# Patient Record
Sex: Female | Born: 2000 | Race: Black or African American | Hispanic: No | Marital: Single | Smoking: Never smoker
Health system: Southern US, Community
[De-identification: ages and names within clinical notes are randomized; demographics above are authoritative.]

## PROBLEM LIST (undated history)

## (undated) DIAGNOSIS — J4520 Mild intermittent asthma, uncomplicated: Secondary | ICD-10-CM

## (undated) DIAGNOSIS — N83209 Unspecified ovarian cyst, unspecified side: Secondary | ICD-10-CM

## (undated) DIAGNOSIS — R519 Headache, unspecified: Secondary | ICD-10-CM

## (undated) DIAGNOSIS — U071 COVID-19: Secondary | ICD-10-CM

## (undated) DIAGNOSIS — E559 Vitamin D deficiency, unspecified: Secondary | ICD-10-CM

## (undated) HISTORY — DX: Vitamin D deficiency, unspecified: E55.9

## (undated) HISTORY — DX: Unspecified ovarian cyst, unspecified side: N83.209

## (undated) HISTORY — DX: Mild intermittent asthma, uncomplicated: J45.20

## (undated) HISTORY — DX: Headache, unspecified: R51.9

## (undated) HISTORY — PX: NO PAST SURGERIES: SHX2092

---
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
## 2019-08-07 ENCOUNTER — Encounter (HOSPITAL_COMMUNITY): Payer: Self-pay | Admitting: Emergency Medicine

## 2019-08-07 ENCOUNTER — Other Ambulatory Visit: Payer: Self-pay

## 2019-08-07 ENCOUNTER — Emergency Department (HOSPITAL_COMMUNITY)
Admission: EM | Admit: 2019-08-07 | Discharge: 2019-08-07 | Disposition: A | Discharge status: Home / Self Care | Payer: Managed Care, Other (non HMO) | Attending: Emergency Medicine | Admitting: Emergency Medicine

## 2019-08-07 DIAGNOSIS — J019 Acute sinusitis, unspecified: Secondary | ICD-10-CM | POA: Diagnosis not present

## 2019-08-07 DIAGNOSIS — Z79899 Other long term (current) drug therapy: Secondary | ICD-10-CM | POA: Diagnosis not present

## 2019-08-07 DIAGNOSIS — R0981 Nasal congestion: Secondary | ICD-10-CM | POA: Diagnosis present

## 2019-08-07 DIAGNOSIS — J069 Acute upper respiratory infection, unspecified: Secondary | ICD-10-CM | POA: Diagnosis not present

## 2019-08-07 DIAGNOSIS — J4520 Mild intermittent asthma, uncomplicated: Secondary | ICD-10-CM | POA: Insufficient documentation

## 2019-08-07 MED ORDER — FLUTICASONE PROPIONATE 50 MCG/ACT NA SUSP: 2.0000 | Freq: Every day | NASAL | 0 refills | Status: DC

## 2019-08-07 MED ORDER — FEXOFENADINE-PSEUDOEPHED ER 60-120 MG PO TB12: 1.0000 | ORAL_TABLET | Freq: Two times a day (BID) | ORAL | 0 refills | Status: DC

## 2019-08-07 NOTE — ED Provider Notes (Signed)
[facility] DEPT Provider Note   CSN: [id number] Arrival date & time: 08/07/19  1435     History   Chief Complaint Chief Complaint  Patient presents with  . Facial Pain  . Nasal Congestion    HPI Sara Mueller is a 18 y.o. female.     Patient is a 18 year old female with no significant past medical history presenting to emergency department for sinus pressure.  Reports over the last 3 days she has had worsening nasal congestion, dry scratchy throat, dry cough, sinus pressure and sinus pain.  Denies any ear pain, fever, chills, loss of taste or smell, chest pain, shortness of breath, sick contacts.  Has been trying over-the-counter medications without relief.     Past Medical History:  Diagnosis Date  . Constipation   . Intermittent asthma    QVAR rx'd in the past for more persistent symptoms,?compliant?.    Patient Active Problem List   Diagnosis Date Noted  . Health maintenance examination 05/31/2011  . Asthma, mild persistent 04/05/2011  . Headache due to trauma 02/18/2011    History reviewed. No pertinent surgical history.   OB History   No obstetric history on file.      Home Medications    Prior to Admission medications   Medication Sig Start Date End Date Taking? Authorizing Provider  Acidophilus Lactobacillus CAPS Take 1 capsule by mouth 2 (two) times daily as needed. Twice daily as needed for diarrhea Patient not taking: Reported on 02/01/2017 02/19/16   Jean Rosenthal, NP  fexofenadine-pseudoephedrine (ALLEGRA-D) 60-120 MG 12 hr tablet Take 1 tablet by mouth every 12 (twelve) hours. 08/07/19   Alveria Apley, PA-C  fluticasone (FLONASE) 50 MCG/ACT nasal spray Place 2 sprays into both nostrils daily. 08/07/19 09/06/19  Madilyn Hook A, PA-C  ibuprofen (ADVIL,MOTRIN) 200 MG tablet Take 1 tablet (200 mg total) by mouth every 8 (eight) hours as needed. Patient not taking: Reported on 02/01/2017 10/27/14   Jamse Mead,  PA-C  isometheptene-acetaminophen-dichloralphenazone (MIDRIN) [phone] MG capsule Take 1 capsule by mouth 4 (four) times daily as needed for migraine. Maximum 5 capsules in 12 hours for migraine headaches, 8 capsules in 24 hours for tension headaches. 02/01/17   Dorie Rank, MD  ondansetron (ZOFRAN ODT) 4 MG disintegrating tablet 4mg  ODT q4 hours prn nausea/vomit Patient not taking: Reported on 02/01/2017 02/28/15   Elnora Morrison, MD  ondansetron (ZOFRAN ODT) 4 MG disintegrating tablet Take 1 tablet (4 mg total) by mouth every 8 (eight) hours as needed for nausea or vomiting. Patient not taking: Reported on 02/01/2017 02/19/16   Jean Rosenthal, NP    Family History Family History  Problem Relation Age of Onset  . Asthma Brother   . Seizures Brother     Social History Social History   Tobacco Use  . Smoking status: Never Smoker  . Smokeless tobacco: Never Used  Substance Use Topics  . Alcohol use: No  . Drug use: No     Allergies   Patient has no known allergies.   Review of Systems Review of Systems  Constitutional: Negative for activity change, appetite change, diaphoresis, fatigue, fever and unexpected weight change.  HENT: Positive for congestion, postnasal drip, rhinorrhea, sinus pressure, sinus pain and sore throat. Negative for dental problem, drooling, ear discharge, facial swelling, mouth sores, nosebleeds, sneezing, tinnitus, trouble swallowing and voice change.   Respiratory: Positive for cough. Negative for chest tightness, shortness of breath, wheezing and stridor.   Cardiovascular: Negative for  chest pain, palpitations and leg swelling.  Gastrointestinal: Negative for abdominal distention, abdominal pain, nausea and vomiting.  Genitourinary: Negative for dysuria.  Musculoskeletal: Negative for back pain.  Skin: Negative for rash.  Neurological: Negative for dizziness, light-headedness and headaches.     Physical Exam Updated Vital Signs BP 113/73    Pulse (!) 110   Temp 99.1 F (37.3 C) (Oral)   Resp 18   SpO2 100%   Physical Exam Vitals signs and nursing note reviewed.  Constitutional:      General: She is not in acute distress.    Appearance: Normal appearance. She is not ill-appearing, toxic-appearing or diaphoretic.  HENT:     Head: Normocephalic.     Right Ear: Tympanic membrane normal.     Left Ear: Tympanic membrane normal.     Nose: Congestion and rhinorrhea present.     Mouth/Throat:     Mouth: Mucous membranes are moist.     Pharynx: Oropharynx is clear.  Eyes:     Conjunctiva/sclera: Conjunctivae normal.     Pupils: Pupils are equal, round, and reactive to light.  Cardiovascular:     Rate and Rhythm: Normal rate and regular rhythm.  Pulmonary:     Effort: Pulmonary effort is normal.     Breath sounds: Normal breath sounds. No wheezing or rhonchi.  Abdominal:     General: Abdomen is flat.  Skin:    General: Skin is dry.  Neurological:     General: No focal deficit present.     Mental Status: She is alert.  Psychiatric:        Mood and Affect: Mood normal.      ED Treatments / Results  Labs (all labs ordered are listed, but only abnormal results are displayed) Labs Reviewed - No data to display  EKG None  Radiology No results found.  Procedures Procedures (including critical care time)  Medications Ordered in ED Medications - No data to display   Initial Impression / Assessment and Plan / ED Course  I have reviewed the triage vital signs and the nursing notes.  Pertinent labs & imaging results that were available during my care of the patient were reviewed by me and considered in my medical decision making (see chart for details).  Clinical Course as of Aug 06 1614  Tue Aug 07, 2019  1614 Patient here with symptoms of allergic rhinitis. Happens every years around this time but has been more persistent this time. No fever. No covid exposure, declines covid test. Will treat with allegra and  fluticasone. Advised on return precautions   [KM]    Clinical Course User Index [KM] Arlyn DunningMcLean, Miamor Ayler A, PA-C       Based on review of vitals, medical screening exam, lab work and/or imaging, there does not appear to be an acute, emergent etiology for the patient's symptoms. Counseled pt on good return precautions and encouraged both PCP and ED follow-up as needed.  Prior to discharge, I also discussed incidental imaging findings with patient in detail and advised appropriate, recommended follow-up in detail.  Clinical Impression: 1. Acute non-recurrent sinusitis, unspecified location   2. Acute upper respiratory infection     Disposition: Discharge  Prior to providing a prescription for a controlled substance, I independently reviewed the patient's recent prescription history on the [place]  Controlled Substance Reporting System. The patient had no recent or regular prescriptions and was deemed appropriate for a brief, less than 3 day prescription of narcotic for acute analgesia.  This note was prepared with assistance of Conservation officer, historic buildings. Occasional wrong-word or sound-a-like substitutions may have occurred due to the inherent limitations of voice recognition software.    Final Clinical Impressions(s) / ED Diagnoses   Final diagnoses:  Acute non-recurrent sinusitis, unspecified location  Acute upper respiratory infection    ED Discharge Orders         Ordered    fluticasone (FLONASE) 50 MCG/ACT nasal spray  Daily     08/07/19 1613    fexofenadine-pseudoephedrine (ALLEGRA-D) 60-120 MG 12 hr tablet  Every 12 hours     08/07/19 1613           Jeral Pinch 08/07/19 1615    Lorre Nick, MD 08/09/19 1347

## 2019-08-07 NOTE — Discharge Instructions (Signed)
Thank you for allowing me to care for you today. Please return to the emergency department if you have new or worsening symptoms. Take your medications as instructed.  ° °

## 2019-08-07 NOTE — ED Triage Notes (Signed)
Pt c/o nasal congestion and sinus facial pains since this weekend. Took Elderberry and Sudafed yesterday.

## 2019-10-21 ENCOUNTER — Other Ambulatory Visit: Payer: Self-pay

## 2019-10-21 ENCOUNTER — Encounter (HOSPITAL_COMMUNITY): Payer: Self-pay | Admitting: Emergency Medicine

## 2019-10-21 ENCOUNTER — Emergency Department (HOSPITAL_COMMUNITY)
Admission: EM | Admit: 2019-10-21 | Discharge: 2019-10-21 | Disposition: A | Discharge status: Home / Self Care | Payer: Managed Care, Other (non HMO) | Attending: Emergency Medicine | Admitting: Emergency Medicine

## 2019-10-21 DIAGNOSIS — U071 COVID-19: Secondary | ICD-10-CM | POA: Diagnosis not present

## 2019-10-21 DIAGNOSIS — R000 Tachycardia, unspecified: Secondary | ICD-10-CM | POA: Diagnosis present

## 2019-10-21 DIAGNOSIS — Z79899 Other long term (current) drug therapy: Secondary | ICD-10-CM | POA: Insufficient documentation

## 2019-10-21 DIAGNOSIS — J45909 Unspecified asthma, uncomplicated: Secondary | ICD-10-CM | POA: Insufficient documentation

## 2019-10-21 HISTORY — DX: COVID-19: U07.1

## 2019-10-21 NOTE — ED Provider Notes (Signed)
[facility] EMERGENCY DEPARTMENT Provider Note   CSN: [id number] Arrival date & time: 10/21/19  1416     History Chief Complaint  Patient presents with   COVID +   Tachycardia    Sara Mueller is a 19 y.o. female with PMH significant for positive COVID-19 testing 10/18/2019 at [facility] who presents to the ED with complaints of fluctuating tachycardia according to her phone.  Patient has an app on her phone that she has been checking regularly to gauge heart rate.  She reportedly had symptoms of headache, cough, nausea, fatigue, body aches, and congestion that began 10/16/2019 and effectively resolved 10/19/2019.  She does endorse some loose stools the past couple of days as well as some diminished p.o. intake.  She reports that she has not had any fevers or chills since 10/19/2019.  She presents to the ED purely because she is concerned that her elevated heart rate is particularly concerning for an emergent process.  Prior to her COVID-19 diagnosis, she did not take any medications regularly and has no significant PMH.  She denies any abdominal pain, nausea or vomiting, chest pain, pleuritic cough or pleuritic chest discomfort, difficulty breathing, syncopal episode, or dizziness.  She lives at home with her mother who has been asymptomatic, but she is isolating in her room.  HPI     Past Medical History:  Diagnosis Date   Constipation    COVID-19    Intermittent asthma    QVAR rx'd in the past for more persistent symptoms,?compliant?.    Patient Active Problem List   Diagnosis Date Noted   Health maintenance examination 05/31/2011   Asthma, mild persistent 04/05/2011   Headache due to trauma 02/18/2011    History reviewed. No pertinent surgical history.   OB History   No obstetric history on file.     Family History  Problem Relation Age of Onset   Asthma Brother    Seizures Brother     Social History   Tobacco Use   Smoking status: Never  Smoker   Smokeless tobacco: Never Used  Substance Use Topics   Alcohol use: No   Drug use: No    Home Medications Prior to Admission medications   Medication Sig Start Date End Date Taking? Authorizing Provider  Acidophilus Lactobacillus CAPS Take 1 capsule by mouth 2 (two) times daily as needed. Twice daily as needed for diarrhea Patient not taking: Reported on 02/01/2017 02/19/16   Sherrilee Gilles, NP  fexofenadine-pseudoephedrine (ALLEGRA-D) 60-120 MG 12 hr tablet Take 1 tablet by mouth every 12 (twelve) hours. 08/07/19   Arlyn Dunning, PA-C  fluticasone (FLONASE) 50 MCG/ACT nasal spray Place 2 sprays into both nostrils daily. 08/07/19 09/06/19  Ronnie Doss A, PA-C  ibuprofen (ADVIL,MOTRIN) 200 MG tablet Take 1 tablet (200 mg total) by mouth every 8 (eight) hours as needed. Patient not taking: Reported on 02/01/2017 10/27/14   Raymon Mutton, PA-C  isometheptene-acetaminophen-dichloralphenazone (MIDRIN) [phone] MG capsule Take 1 capsule by mouth 4 (four) times daily as needed for migraine. Maximum 5 capsules in 12 hours for migraine headaches, 8 capsules in 24 hours for tension headaches. 02/01/17   Linwood Dibbles, MD  ondansetron (ZOFRAN ODT) 4 MG disintegrating tablet 4mg  ODT q4 hours prn nausea/vomit Patient not taking: Reported on 02/01/2017 02/28/15   03/02/15, MD  ondansetron (ZOFRAN ODT) 4 MG disintegrating tablet Take 1 tablet (4 mg total) by mouth every 8 (eight) hours as needed for nausea or vomiting. Patient not  taking: Reported on 02/01/2017 02/19/16   Jean Rosenthal, NP    Allergies    Patient has no known allergies.  Review of Systems   Review of Systems  All other systems reviewed and are negative.   Physical Exam Updated Vital Signs BP 130/70    Pulse 98    Temp 98.5 F (36.9 C) (Oral)    Resp 16    SpO2 100%   Physical Exam Vitals and nursing note reviewed. Exam conducted with a chaperone present.  Constitutional:      Appearance: Normal  appearance.  HENT:     Head: Normocephalic and atraumatic.  Eyes:     General: No scleral icterus.    Conjunctiva/sclera: Conjunctivae normal.  Cardiovascular:     Rate and Rhythm: Normal rate and regular rhythm.     Pulses: Normal pulses.     Heart sounds: Normal heart sounds.  Pulmonary:     Effort: Pulmonary effort is normal. No respiratory distress.     Breath sounds: Normal breath sounds.  Musculoskeletal:     Right lower leg: No edema.     Left lower leg: No edema.  Skin:    General: Skin is dry.  Neurological:     Mental Status: She is alert.     GCS: GCS eye subscore is 4. GCS verbal subscore is 5. GCS motor subscore is 6.  Psychiatric:        Mood and Affect: Mood normal.        Behavior: Behavior normal.        Thought Content: Thought content normal.     ED Results / Procedures / Treatments   Labs (all labs ordered are listed, but only abnormal results are displayed) Labs Reviewed - No data to display  EKG None  Radiology No results found.  Procedures Procedures (including critical care time)  Medications Ordered in ED Medications - No data to display  ED Course  I have reviewed the triage vital signs and the nursing notes.  Pertinent labs & imaging results that were available during my care of the patient were reviewed by me and considered in my medical decision making (see chart for details).    MDM Rules/Calculators/A&P                      Patient is still recovering from a COVID-19 diagnosis which is likely why she is still feeling ill.  She denies any history of coagulopathy, DVT, PE, recent surgery, recent mobilization.  While COVID-19 may contribute to prothrombotic state, low suspicion for pulmonary was not.  No evidence of clinical DVT.  She is in no acute distress on my exam.  Her vital signs are all within normal limits.  Patient showed me a log on her phone, checking her vital signs approximately every 10 minutes for a 2-hour duration.   She was within normal limits more times than not.  I believe that there is element of anxiety contributing to her elevated heart rate.  She is also having loose stools and is admitted that she is not been hydrating as well as she knows she should be.  Suspect that mild dehydration is also contributing to her mild, intermittent tachycardia.  She is hemodynamically stable and safe for discharge at this time.  Encouraged patient to use Tylenol or ibuprofen should she develop any fevers, chills, or body aches.  I had a conversation with patient about why lab work and imaging is not warranted  at this time.  Discussed return precautions with the patient.  She voiced understanding and is agreeable to plan.  JAYLANIE BOSCHEE was evaluated in Emergency Department on 10/21/2019 for the symptoms described in the history of present illness. She was evaluated in the context of the global COVID-19 pandemic, which necessitated consideration that the patient might be at risk for infection with the SARS-CoV-2 virus that causes COVID-19. Institutional protocols and algorithms that pertain to the evaluation of patients at risk for COVID-19 are in a state of rapid change based on information released by regulatory bodies including the CDC and federal and state organizations. These policies and algorithms were followed during the patient's care in the ED.   Final Clinical Impression(s) / ED Diagnoses Final diagnoses:  COVID-19    Rx / DC Orders ED Discharge Orders    None       Lorelee New, PA-C 10/21/19 1610    Tegeler, Canary Brim, MD 10/22/19 [phone]

## 2019-10-21 NOTE — ED Notes (Signed)
Patient Alert and oriented to baseline. Stable and ambulatory to baseline. Patient verbalized understanding of the discharge instructions.  Patient belongings were taken by the patient.   

## 2019-10-21 NOTE — Discharge Instructions (Addendum)
Please read the attachments.  Return to the ED or seek medical attention for any new or worsening symptoms per our discussion.  Consider pulse oximeter.

## 2019-10-21 NOTE — ED Triage Notes (Signed)
Pt states she was COVID + on Thursday.  Reports she had symptoms Tuesday-Thursday but then feeling fine.  Reports fluctuating HR from 80-118 according to her watch.  Denies any symptoms at present.

## 2021-01-22 DIAGNOSIS — J309 Allergic rhinitis, unspecified: Secondary | ICD-10-CM | POA: Insufficient documentation

## 2021-01-26 DIAGNOSIS — F329 Major depressive disorder, single episode, unspecified: Secondary | ICD-10-CM | POA: Insufficient documentation

## 2021-01-30 ENCOUNTER — Emergency Department (HOSPITAL_COMMUNITY)
Admission: EM | Admit: 2021-01-30 | Discharge: 2021-01-30 | Discharge status: Against Medical Advice | Payer: Managed Care, Other (non HMO)

## 2021-01-30 ENCOUNTER — Other Ambulatory Visit: Payer: Self-pay

## 2021-07-16 ENCOUNTER — Emergency Department (HOSPITAL_COMMUNITY)
Admission: EM | Admit: 2021-07-16 | Discharge: 2021-07-17 | Disposition: A | Discharge status: Home / Self Care | Payer: Managed Care, Other (non HMO) | Attending: Emergency Medicine | Admitting: Emergency Medicine

## 2021-07-16 ENCOUNTER — Encounter (HOSPITAL_COMMUNITY): Payer: Self-pay

## 2021-07-16 DIAGNOSIS — Z5321 Procedure and treatment not carried out due to patient leaving prior to being seen by health care provider: Secondary | ICD-10-CM | POA: Diagnosis not present

## 2021-07-16 DIAGNOSIS — N9489 Other specified conditions associated with female genital organs and menstrual cycle: Secondary | ICD-10-CM | POA: Diagnosis not present

## 2021-07-16 DIAGNOSIS — R1110 Vomiting, unspecified: Secondary | ICD-10-CM | POA: Insufficient documentation

## 2021-07-16 DIAGNOSIS — M546 Pain in thoracic spine: Secondary | ICD-10-CM | POA: Diagnosis not present

## 2021-07-16 LAB — COMPREHENSIVE METABOLIC PANEL
ALT: 16 U/L (ref 0–44)
AST: 17 U/L (ref 15–41)
Albumin: 4 g/dL (ref 3.5–5.0)
Alkaline Phosphatase: 113 U/L (ref 38–126)
Anion gap: 10 (ref 5–15)
BUN: 6 mg/dL (ref 6–20)
CO2: 21 mmol/L — ABNORMAL LOW (ref 22–32)
Calcium: 9.1 mg/dL (ref 8.9–10.3)
Chloride: 104 mmol/L (ref 98–111)
Creatinine, Ser: 0.79 mg/dL (ref 0.44–1.00)
GFR, Estimated: >60 mL/min (ref >60)
Glucose, Bld: 94 mg/dL (ref 70–99)
Potassium: 3.6 mmol/L (ref 3.5–5.1)
Sodium: 135 mmol/L (ref 135–145)
Total Bilirubin: 0.6 mg/dL (ref 0.3–1.2)
Total Protein: 7.3 g/dL (ref 6.5–8.1)

## 2021-07-16 LAB — URINALYSIS, ROUTINE W REFLEX MICROSCOPIC
Bilirubin Urine: NEGATIVE
Glucose, UA: NEGATIVE mg/dL
Hgb urine dipstick: NEGATIVE
Ketones, ur: 20 mg/dL — AB
Leukocytes,Ua: NEGATIVE
Nitrite: NEGATIVE
Protein, ur: NEGATIVE mg/dL
Specific Gravity, Urine: 1.02 (ref 1.005–1.030)
pH: 6 (ref 5.0–8.0)

## 2021-07-16 LAB — CBC WITH DIFFERENTIAL/PLATELET
Abs Immature Granulocytes: 0.06 10*3/uL (ref 0.00–0.07)
Basophils Absolute: 0.1 10*3/uL (ref 0.0–0.1)
Basophils Relative: 1 %
Eosinophils Absolute: 0.1 10*3/uL (ref 0.0–0.5)
Eosinophils Relative: 1 %
HCT: 41.9 % (ref 36.0–46.0)
Hemoglobin: 12.8 g/dL (ref 12.0–15.0)
Immature Granulocytes: 1 %
Lymphocytes Relative: 5 %
Lymphs Abs: 0.5 10*3/uL — ABNORMAL LOW (ref 0.7–4.0)
MCH: 23.4 pg — ABNORMAL LOW (ref 26.0–34.0)
MCHC: 30.5 g/dL (ref 30.0–36.0)
MCV: 76.6 fL — ABNORMAL LOW (ref 80.0–100.0)
Monocytes Absolute: 1.1 10*3/uL — ABNORMAL HIGH (ref 0.1–1.0)
Monocytes Relative: 11 %
Neutro Abs: 8.3 10*3/uL — ABNORMAL HIGH (ref 1.7–7.7)
Neutrophils Relative %: 81 %
Platelets: 227 10*3/uL (ref 150–400)
RBC: 5.47 MIL/uL — ABNORMAL HIGH (ref 3.87–5.11)
RDW: 15.5 % (ref 11.5–15.5)
WBC: 10.1 10*3/uL (ref 4.0–10.5)
nRBC: 0 % (ref 0.0–0.2)

## 2021-07-16 LAB — I-STAT BETA HCG BLOOD, ED (MC, WL, AP ONLY): I-stat hCG, quantitative: <5 m[IU]/mL (ref <5)

## 2021-07-16 MED ORDER — ONDANSETRON 4 MG PO TBDP
4.0000 mg | ORAL_TABLET | Freq: Once | ORAL | Status: AC
  Administered 2021-07-16: 4 mg via ORAL
  Filled 2021-07-16: qty 1

## 2021-07-16 NOTE — ED Triage Notes (Signed)
Pt states she is unable to keep anything down for since yesterday. Pt also c.o upper back pain. LMP 9/13

## 2021-07-16 NOTE — ED Provider Notes (Signed)
Emergency Medicine Provider Triage Evaluation Note  Sara Mueller , a 20 y.o. female  was evaluated in triage.  Pt complains of emesis.  Multiple episodes of NBNB emesis over the last week.  She was seen by her primary care provider for vaginal bleeding after sexual intercourse on Monday.  At that time had negative pregnancy test.  She has been unable to keep down anything p.o. over the last 24 hours.  Has had some intermittent vaginal bleeding over the last week.  No abdominal pain.  Review of Systems  Positive: Vaginal bleeding, Emesis Negative: Fever, abd pain, pelvic pain  Physical Exam  BP 121/64 (BP Location: Left Arm)   Pulse (!) 109   Temp 98.8 F (37.1 C) (Oral)   Resp 14   LMP 06/23/2021   SpO2 98%  Gen:   Awake, no distress   Resp:  Normal effort  MSK:   Moves extremities without difficulty  Other:    Medical Decision Making  Medically screening exam initiated at 2:42 PM.  Appropriate orders placed.  Claudie Leach was informed that the remainder of the evaluation will be completed by another provider, this initial triage assessment does not replace that evaluation, and the importance of remaining in the ED until their evaluation is complete.  Emesis, vaginal bleeding   Sharde Gover A, PA-C 07/16/21 1443    Maia Plan, MD 07/20/21 [phone]

## 2021-07-17 NOTE — ED Notes (Signed)
Pt called 3x no answer  

## 2021-09-18 ENCOUNTER — Other Ambulatory Visit: Payer: Self-pay | Admitting: Obstetrics & Gynecology

## 2021-09-18 DIAGNOSIS — R222 Localized swelling, mass and lump, trunk: Secondary | ICD-10-CM

## 2021-09-19 ENCOUNTER — Other Ambulatory Visit: Payer: Self-pay | Admitting: Obstetrics & Gynecology

## 2021-09-19 DIAGNOSIS — R102 Pelvic and perineal pain: Secondary | ICD-10-CM

## 2021-10-11 NOTE — L&D Delivery Note (Signed)
[facility]N [facility] Delivery Note  Sara Mueller is a 21 y.o. G1P0 s/p VAVD at [redacted]w[redacted]d. She was admitted for latent labor/DFM.   ROM: 11h 15m with thick meconium fluid GBS Status:  Negative/-- (10/11 1606) Maximum Maternal Temperature: 101.4, treated for triple I w/ tylenol, amp +gent  Labor Progress: Initial SVE: 3/80/-2. She then progressed to complete.   Delivery Date/Time: 08/20/2022 @2319  Delivery: Presented to room and started pushing with patient. Dr. presented to the room with baby at station 0 and did several pushes with lateral pubic pressure support. Station improved to +2. The NICU team was called for delivery. A vacuum was applied 2/2 low HR and fetal intolerance. She pushed for a total of 8 mins thereafter. The initial vacuum with this provider had 2 pop offs. Dr. Shawnie Pons resumed vacuum pushing for additional 3 pushes. Head delivered ROP. A loose body cord was present. Shoulder and body delivered in usual fashion. The cord was clamped x 2 immediately by this provider and handed over to the waiting neonatologist team. Cord gases drawn. Cord blood drawn. Placenta delivered spontaneously with gentle cord traction. The fundus firm with massage and Pitocin. Labia, perineum, vagina, and cervix inspected and found to have a 3rd degree class B. This laceration was repaired with 3.0 vicyl.   Baby Weight: pending  Placenta: 3 vessel, intact. Meconium stained. Sent to pathology Complications: Vacuum assisted 2/2 prolonged deceleration of fetal HR Lacerations: 3rd degree class B EBL: 420 mL Analgesia: Epidural   Infant:  APGAR (1 MIN): 1   APGAR (5 MINS): 7   APGAR (10 MINS): 8  Shawnie Pons, DO OB Fellow, Faculty Lav[facility] J[facility], [facility] 08/20/2022, 1:03 AM

## 2021-10-15 ENCOUNTER — Encounter (HOSPITAL_BASED_OUTPATIENT_CLINIC_OR_DEPARTMENT_OTHER): Payer: Self-pay | Admitting: Emergency Medicine

## 2021-10-15 ENCOUNTER — Emergency Department (HOSPITAL_BASED_OUTPATIENT_CLINIC_OR_DEPARTMENT_OTHER)
Admission: EM | Admit: 2021-10-15 | Discharge: 2021-10-15 | Disposition: A | Discharge status: Home / Self Care | Payer: Managed Care, Other (non HMO) | Attending: Emergency Medicine | Admitting: Emergency Medicine

## 2021-10-15 ENCOUNTER — Other Ambulatory Visit: Payer: Self-pay

## 2021-10-15 ENCOUNTER — Other Ambulatory Visit: Payer: Managed Care, Other (non HMO)

## 2021-10-15 DIAGNOSIS — K529 Noninfective gastroenteritis and colitis, unspecified: Secondary | ICD-10-CM | POA: Insufficient documentation

## 2021-10-15 DIAGNOSIS — Z8616 Personal history of COVID-19: Secondary | ICD-10-CM | POA: Insufficient documentation

## 2021-10-15 DIAGNOSIS — R112 Nausea with vomiting, unspecified: Secondary | ICD-10-CM | POA: Diagnosis present

## 2021-10-15 DIAGNOSIS — R197 Diarrhea, unspecified: Secondary | ICD-10-CM

## 2021-10-15 DIAGNOSIS — J4530 Mild persistent asthma, uncomplicated: Secondary | ICD-10-CM | POA: Diagnosis not present

## 2021-10-15 MED ORDER — ONDANSETRON 4 MG PO TBDP: 4.0000 mg | ORAL_TABLET | Freq: Three times a day (TID) | ORAL | 0 refills | Status: AC | PRN

## 2021-10-15 MED ORDER — ONDANSETRON 4 MG PO TBDP
4.0000 mg | ORAL_TABLET | Freq: Once | ORAL | Status: AC
  Administered 2021-10-15: 4 mg via ORAL
  Filled 2021-10-15: qty 1

## 2021-10-15 NOTE — ED Triage Notes (Signed)
Pt presents to ED POV. Pt c/o n/v/d, cough since yesterday. Denies fever.

## 2021-10-15 NOTE — ED Notes (Signed)
Pt with ginger ale and crackers at bedside for PO challenge
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
## 2021-11-10 ENCOUNTER — Other Ambulatory Visit: Payer: Self-pay

## 2021-11-10 ENCOUNTER — Encounter (HOSPITAL_BASED_OUTPATIENT_CLINIC_OR_DEPARTMENT_OTHER): Payer: Self-pay

## 2021-11-10 ENCOUNTER — Emergency Department (HOSPITAL_BASED_OUTPATIENT_CLINIC_OR_DEPARTMENT_OTHER): Payer: Managed Care, Other (non HMO)

## 2021-11-10 ENCOUNTER — Emergency Department (HOSPITAL_BASED_OUTPATIENT_CLINIC_OR_DEPARTMENT_OTHER)
Admission: EM | Admit: 2021-11-10 | Discharge: 2021-11-10 | Disposition: A | Discharge status: Home / Self Care | Payer: Managed Care, Other (non HMO) | Attending: Emergency Medicine | Admitting: Emergency Medicine

## 2021-11-10 DIAGNOSIS — N83292 Other ovarian cyst, left side: Secondary | ICD-10-CM | POA: Diagnosis not present

## 2021-11-10 DIAGNOSIS — N83291 Other ovarian cyst, right side: Secondary | ICD-10-CM | POA: Insufficient documentation

## 2021-11-10 DIAGNOSIS — R102 Pelvic and perineal pain: Secondary | ICD-10-CM | POA: Diagnosis present

## 2021-11-10 DIAGNOSIS — N83209 Unspecified ovarian cyst, unspecified side: Secondary | ICD-10-CM

## 2021-11-10 LAB — PREGNANCY, URINE: Preg Test, Ur: NEGATIVE

## 2021-11-10 MED ORDER — IBUPROFEN 800 MG PO TABS
800.0000 mg | ORAL_TABLET | Freq: Once | ORAL | Status: AC
  Administered 2021-11-10: 800 mg via ORAL
  Filled 2021-11-10: qty 1

## 2021-11-10 MED ORDER — ONDANSETRON 4 MG PO TBDP
4.0000 mg | ORAL_TABLET | Freq: Once | ORAL | Status: AC
  Administered 2021-11-10: 4 mg via ORAL
  Filled 2021-11-10: qty 1

## 2021-11-10 NOTE — Discharge Instructions (Signed)
You were seen in the ER today for your pelvic pain.  You were found of a ruptured ovarian cyst on your ultrasound.  Your pain and bleeding is likely related to your menstrual cycle.  You may alternate Tylenol and naproxen as you have at home.  Follow-up with your primary care doctor and return to ER if develop any other new severe symptoms
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
## 2021-12-04 ENCOUNTER — Emergency Department (HOSPITAL_COMMUNITY): Payer: Managed Care, Other (non HMO)

## 2021-12-04 ENCOUNTER — Other Ambulatory Visit: Payer: Self-pay

## 2021-12-04 ENCOUNTER — Emergency Department (HOSPITAL_COMMUNITY)
Admission: EM | Admit: 2021-12-04 | Discharge: 2021-12-04 | Disposition: A | Discharge status: Home / Self Care | Payer: Managed Care, Other (non HMO) | Attending: Emergency Medicine | Admitting: Emergency Medicine

## 2021-12-04 DIAGNOSIS — N83209 Unspecified ovarian cyst, unspecified side: Secondary | ICD-10-CM | POA: Insufficient documentation

## 2021-12-04 DIAGNOSIS — Y92410 Unspecified street and highway as the place of occurrence of the external cause: Secondary | ICD-10-CM | POA: Insufficient documentation

## 2021-12-04 DIAGNOSIS — R102 Pelvic and perineal pain: Secondary | ICD-10-CM

## 2021-12-04 DIAGNOSIS — Z7951 Long term (current) use of inhaled steroids: Secondary | ICD-10-CM | POA: Insufficient documentation

## 2021-12-04 LAB — COMPREHENSIVE METABOLIC PANEL
ALT: 11 U/L (ref 0–44)
AST: 14 U/L — ABNORMAL LOW (ref 15–41)
Albumin: 3.7 g/dL (ref 3.5–5.0)
Alkaline Phosphatase: 79 U/L (ref 38–126)
Anion gap: 7 (ref 5–15)
BUN: 9 mg/dL (ref 6–20)
CO2: 21 mmol/L — ABNORMAL LOW (ref 22–32)
Calcium: 9.1 mg/dL (ref 8.9–10.3)
Chloride: 108 mmol/L (ref 98–111)
Creatinine, Ser: 0.89 mg/dL (ref 0.44–1.00)
GFR, Estimated: >60 mL/min (ref >60)
Glucose, Bld: 95 mg/dL (ref 70–99)
Potassium: 3.9 mmol/L (ref 3.5–5.1)
Sodium: 136 mmol/L (ref 135–145)
Total Bilirubin: 0.3 mg/dL (ref 0.3–1.2)
Total Protein: 6.7 g/dL (ref 6.5–8.1)

## 2021-12-04 LAB — CBC WITH DIFFERENTIAL/PLATELET
Abs Immature Granulocytes: 0.04 10*3/uL (ref 0.00–0.07)
Basophils Absolute: 0.1 10*3/uL (ref 0.0–0.1)
Basophils Relative: 0 %
Eosinophils Absolute: 0.2 10*3/uL (ref 0.0–0.5)
Eosinophils Relative: 2 %
HCT: 39.7 % (ref 36.0–46.0)
Hemoglobin: 12 g/dL (ref 12.0–15.0)
Immature Granulocytes: 0 %
Lymphocytes Relative: 18 %
Lymphs Abs: 2.2 10*3/uL (ref 0.7–4.0)
MCH: 23.4 pg — ABNORMAL LOW (ref 26.0–34.0)
MCHC: 30.2 g/dL (ref 30.0–36.0)
MCV: 77.5 fL — ABNORMAL LOW (ref 80.0–100.0)
Monocytes Absolute: 1 10*3/uL (ref 0.1–1.0)
Monocytes Relative: 8 %
Neutro Abs: 8.7 10*3/uL — ABNORMAL HIGH (ref 1.7–7.7)
Neutrophils Relative %: 72 %
Platelets: 197 10*3/uL (ref 150–400)
RBC: 5.12 MIL/uL — ABNORMAL HIGH (ref 3.87–5.11)
RDW: 15.3 % (ref 11.5–15.5)
WBC: 12.2 10*3/uL — ABNORMAL HIGH (ref 4.0–10.5)
nRBC: 0 % (ref 0.0–0.2)

## 2021-12-04 LAB — URINALYSIS, ROUTINE W REFLEX MICROSCOPIC
Bilirubin Urine: NEGATIVE
Glucose, UA: NEGATIVE mg/dL
Hgb urine dipstick: NEGATIVE
Ketones, ur: NEGATIVE mg/dL
Leukocytes,Ua: NEGATIVE
Nitrite: NEGATIVE
Protein, ur: NEGATIVE mg/dL
Specific Gravity, Urine: 1.018 (ref 1.005–1.030)
pH: 5 (ref 5.0–8.0)

## 2021-12-04 LAB — PREGNANCY, URINE: Preg Test, Ur: NEGATIVE

## 2021-12-04 MED ORDER — MORPHINE SULFATE (PF) 4 MG/ML IV SOLN
4.0000 mg | Freq: Once | INTRAVENOUS | Status: AC
Start: 2021-12-04 — End: 2021-12-04
  Administered 2021-12-04: 4 mg via INTRAVENOUS
  Filled 2021-12-04: qty 1

## 2021-12-04 MED ORDER — NAPROXEN 500 MG PO TABS: 500.0000 mg | ORAL_TABLET | Freq: Two times a day (BID) | ORAL | 0 refills | Status: DC

## 2021-12-04 MED ORDER — ONDANSETRON 4 MG PO TBDP
4.0000 mg | ORAL_TABLET | Freq: Once | ORAL | Status: AC
  Administered 2021-12-04: 4 mg via ORAL
  Filled 2021-12-04: qty 1

## 2021-12-04 MED ORDER — METHOCARBAMOL 500 MG PO TABS: 500.0000 mg | ORAL_TABLET | Freq: Two times a day (BID) | ORAL | 0 refills | Status: DC

## 2021-12-04 MED ORDER — IOHEXOL 300 MG/ML  SOLN
100.0000 mL | Freq: Once | INTRAMUSCULAR | Status: AC | PRN
  Administered 2021-12-04: 100 mL via INTRAVENOUS

## 2021-12-04 MED ORDER — ONDANSETRON 4 MG PO TBDP: 4.0000 mg | ORAL_TABLET | Freq: Three times a day (TID) | ORAL | 0 refills | Status: DC | PRN
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
## 2021-12-04 NOTE — Discharge Instructions (Addendum)
Please pick up medications and take as needed.  DO NOT DRIVE OR DRINK ALCOHOL WHILE ON THE MUSCLE RELAXER AS IT CAN MAKE YOU DROWSY.   Follow up with your OBGYN and PCP regarding ED visit.   Return to the ED for any new/worsening symptoms
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
## 2021-12-31 LAB — OB RESULTS CONSOLE RUBELLA ANTIBODY, IGM: Rubella: IMMUNE

## 2021-12-31 LAB — OB RESULTS CONSOLE ABO/RH: RH Type: POSITIVE

## 2021-12-31 LAB — OB RESULTS CONSOLE HIV ANTIBODY (ROUTINE TESTING): HIV: NONREACTIVE

## 2021-12-31 LAB — OB RESULTS CONSOLE ANTIBODY SCREEN: Antibody Screen: NEGATIVE

## 2021-12-31 LAB — OB RESULTS CONSOLE HEPATITIS B SURFACE ANTIGEN: Hepatitis B Surface Ag: NEGATIVE

## 2021-12-31 LAB — OB RESULTS CONSOLE RPR: RPR: NONREACTIVE

## 2022-01-13 LAB — OB RESULTS CONSOLE GC/CHLAMYDIA
Chlamydia: NEGATIVE
Neisseria Gonorrhea: NEGATIVE

## 2022-02-07 ENCOUNTER — Other Ambulatory Visit: Payer: Self-pay

## 2022-02-07 ENCOUNTER — Encounter (HOSPITAL_BASED_OUTPATIENT_CLINIC_OR_DEPARTMENT_OTHER): Payer: Self-pay | Admitting: Emergency Medicine

## 2022-02-07 ENCOUNTER — Emergency Department (HOSPITAL_BASED_OUTPATIENT_CLINIC_OR_DEPARTMENT_OTHER)
Admission: EM | Admit: 2022-02-07 | Discharge: 2022-02-07 | Disposition: A | Discharge status: Home / Self Care | Payer: Managed Care, Other (non HMO) | Attending: Emergency Medicine | Admitting: Emergency Medicine

## 2022-02-07 DIAGNOSIS — O219 Vomiting of pregnancy, unspecified: Secondary | ICD-10-CM | POA: Insufficient documentation

## 2022-02-07 DIAGNOSIS — Z3A11 11 weeks gestation of pregnancy: Secondary | ICD-10-CM | POA: Diagnosis not present

## 2022-02-07 DIAGNOSIS — R824 Acetonuria: Secondary | ICD-10-CM | POA: Insufficient documentation

## 2022-02-07 LAB — URINALYSIS, ROUTINE W REFLEX MICROSCOPIC
Bilirubin Urine: NEGATIVE
Glucose, UA: NEGATIVE mg/dL
Hgb urine dipstick: NEGATIVE
Ketones, ur: 15 mg/dL — AB
Leukocytes,Ua: NEGATIVE
Nitrite: NEGATIVE
Protein, ur: NEGATIVE mg/dL
Specific Gravity, Urine: >=1.03 (ref 1.005–1.030)
pH: 6.5 (ref 5.0–8.0)

## 2022-02-07 LAB — COMPREHENSIVE METABOLIC PANEL
ALT: 7 U/L (ref 0–44)
AST: 16 U/L (ref 15–41)
Albumin: 3.7 g/dL (ref 3.5–5.0)
Alkaline Phosphatase: 47 U/L (ref 38–126)
Anion gap: 6 (ref 5–15)
BUN: 8 mg/dL (ref 6–20)
CO2: 21 mmol/L — ABNORMAL LOW (ref 22–32)
Calcium: 9.2 mg/dL (ref 8.9–10.3)
Chloride: 108 mmol/L (ref 98–111)
Creatinine, Ser: 0.63 mg/dL (ref 0.44–1.00)
GFR, Estimated: >60 mL/min (ref >60)
Glucose, Bld: 80 mg/dL (ref 70–99)
Potassium: 4.1 mmol/L (ref 3.5–5.1)
Sodium: 135 mmol/L (ref 135–145)
Total Bilirubin: 0.6 mg/dL (ref 0.3–1.2)
Total Protein: 7 g/dL (ref 6.5–8.1)

## 2022-02-07 MED ORDER — METOCLOPRAMIDE HCL 10 MG PO TABS: 10.0000 mg | ORAL_TABLET | Freq: Four times a day (QID) | ORAL | 0 refills | Status: DC

## 2022-02-07 MED ORDER — METOCLOPRAMIDE HCL 5 MG/ML IJ SOLN: 5.0000 mg | Freq: Once | INTRAMUSCULAR | Status: DC

## 2022-02-07 MED ORDER — DIPHENHYDRAMINE HCL 50 MG/ML IJ SOLN: 25.0000 mg | Freq: Once | INTRAMUSCULAR | Status: DC

## 2022-02-07 MED ORDER — METOCLOPRAMIDE HCL 10 MG PO TABS
10.0000 mg | ORAL_TABLET | Freq: Once | ORAL | Status: AC
  Administered 2022-02-07: 10 mg via ORAL
  Filled 2022-02-07: qty 1

## 2022-02-07 MED ORDER — SODIUM CHLORIDE 0.9 % IV BOLUS: 1000.0000 mL | Freq: Once | INTRAVENOUS | Status: DC

## 2022-02-07 NOTE — ED Notes (Signed)
Attempted IV access to RAC, tol well , blood obtained for chem. ?

## 2022-02-07 NOTE — Discharge Instructions (Signed)
Please read and follow all provided instructions. ? ?Your diagnoses today include:  ?1. Nausea and vomiting in pregnancy   ? ? ?Tests performed today include: ?Urine: Suggests mild dehydration ?Electrolytes and kidney function: Looks okay ?Vital signs. See below for your results today.  ? ?Medications prescribed:  ?Metoclopramide (Reglan): Medication for nausea ? ?Take any prescribed medications only as directed. ? ?Home care instructions:  ?Follow any educational materials contained in this packet. ? ?*You may use doxylamine, available as an over-the-counter sleeping pills (eg, Unisom Sleep Tabs): One-half of the 25 mg over-the-counter tablet twice a day for vomiting. In addition, pyridoxine 25 mg (Vitamin B6), also available over-the-counter, is taken three or four times per day for vomiting.  ? ?BE VERY CAREFUL not to take multiple medicines containing Tylenol (also called acetaminophen). Doing so can lead to an overdose which can damage your liver and cause liver failure and possibly death.  ? ?Follow-up instructions: ?Please follow-up with your primary care provider in the next 3 days for further evaluation of your symptoms.  ? ?Return instructions:  ?Please return to the Emergency Department if you experience worsening symptoms.  ?Please return if you have any other emergent concerns. ? ?Additional Information: ? ?Your vital signs today were: ?BP 95/66   Pulse 84   Temp 98.5 ?F (36.9 ?C) (Oral)   Resp 15   Ht 5' (1.524 m)   Wt 61.2 kg   LMP 11/10/2021   SpO2 99%   BMI 26.37 kg/m?  ?If your blood pressure (BP) was elevated above 135/85 this visit, please have this repeated by your doctor within one month. ?-------------- ? ?

## 2022-02-07 NOTE — ED Triage Notes (Signed)
Pt c/o Nausea for past couple of days. Pt had 3 episodes of vomiting yesterday. Pt is currently [redacted] weeks pregnant.  ?

## 2022-02-07 NOTE — ED Notes (Signed)
Rn tried iv stick unsuccessful ,Patient tolerated well .Provider is aware ?

## 2022-02-07 NOTE — ED Provider Notes (Signed)
[facility] EMERGENCY DEPARTMENT Provider Note   CSN: [id number] Arrival date & time: 02/07/22  1124     History  Chief Complaint  Patient presents with   Nausea    Sara Mueller is a 21 y.o. female.  Patient who is approximately [redacted] weeks pregnant, has establish care with OB/GYN, presents to the emergency department today for evaluation of nausea and vomiting.  She has had nausea and vomiting over the last 2 days.  Last episode of vomiting was yesterday with severe nausea this morning.  She denies abdominal pain, vaginal bleeding or discharge.  She denies urinary symptoms other than some dark urine.  No chest pain or shortness of breath.  No URI symptoms or fevers.  She has had some occasional nausea with her pregnancy thus far, but no significant vomiting.  No treatments prior to arrival.      Home Medications Prior to Admission medications   Medication Sig Start Date End Date Taking? Authorizing Provider  Acidophilus Lactobacillus CAPS Take 1 capsule by mouth 2 (two) times daily as needed. Twice daily as needed for diarrhea 02/19/16  Yes Scoville, Nadara Mustard, NP  fexofenadine-pseudoephedrine (ALLEGRA-D) 60-120 MG 12 hr tablet Take 1 tablet by mouth every 12 (twelve) hours. 08/07/19  Yes Ronnie Doss A, PA-C  ibuprofen (ADVIL,MOTRIN) 200 MG tablet Take 1 tablet (200 mg total) by mouth every 8 (eight) hours as needed. 10/27/14  Yes [place], Marissa, PA-C  isometheptene-acetaminophen-dichloralphenazone (MIDRIN) 65-100-325 MG capsule Take 1 capsule by mouth 4 (four) times daily as needed for migraine. Maximum 5 capsules in 12 hours for migraine headaches, 8 capsules in 24 hours for tension headaches. 02/01/17  Yes Linwood Dibbles, MD  methocarbamol (ROBAXIN) 500 MG tablet Take 1 tablet (500 mg total) by mouth 2 (two) times daily. 12/04/21  Yes Venter, Margaux, PA-C  ondansetron (ZOFRAN-ODT) 4 MG disintegrating tablet Take 1 tablet (4 mg total) by mouth every 8 (eight) hours as needed for  nausea or vomiting. 12/04/21  Yes Venter, Margaux, PA-C  fluticasone (FLONASE) 50 MCG/ACT nasal spray Place 2 sprays into both nostrils daily. 08/07/19 09/06/19  Ronnie Doss A, PA-C  naproxen (NAPROSYN) 500 MG tablet Take 1 tablet (500 mg total) by mouth 2 (two) times daily. 12/04/21   Tanda Rockers, PA-C      Allergies    Patient has no known allergies.    Review of Systems   Review of Systems  Physical Exam Updated Vital Signs BP 107/71 (BP Location: Left Arm)   Pulse 95   Temp 98.5 F (36.9 C) (Oral)   Resp 16   Ht 5' (1.524 m)   Wt 61.2 kg   LMP 11/10/2021   SpO2 100%   BMI 26.37 kg/m   Physical Exam Vitals and nursing note reviewed.  Constitutional:      General: She is not in acute distress.    Appearance: She is well-developed.  HENT:     Head: Normocephalic and atraumatic.     Right Ear: External ear normal.     Left Ear: External ear normal.     Nose: Nose normal.     Mouth/Throat:     Mouth: Mucous membranes are moist.  Eyes:     Conjunctiva/sclera: Conjunctivae normal.  Cardiovascular:     Rate and Rhythm: Normal rate and regular rhythm.     Heart sounds: No murmur heard. Pulmonary:     Effort: No respiratory distress.     Breath sounds: No wheezing, rhonchi or rales.  Abdominal:     Palpations: Abdomen is soft.     Tenderness: There is no abdominal tenderness. There is no guarding or rebound.  Musculoskeletal:     Cervical back: Normal range of motion and neck supple.     Right lower leg: No edema.     Left lower leg: No edema.  Skin:    General: Skin is warm and dry.     Findings: No rash.  Neurological:     General: No focal deficit present.     Mental Status: She is alert. Mental status is at baseline.     Motor: No weakness.  Psychiatric:        Mood and Affect: Mood normal.    ED Results / Procedures / Treatments   Labs (all labs ordered are listed, but only abnormal results are displayed) Labs Reviewed  COMPREHENSIVE METABOLIC  PANEL - Abnormal; Notable for the following components:      Result Value   CO2 21 (*)    All other components within normal limits  URINALYSIS, ROUTINE W REFLEX MICROSCOPIC - Abnormal; Notable for the following components:   Ketones, ur 15 (*)    All other components within normal limits  CBC WITH DIFFERENTIAL/PLATELET    EKG None  Radiology No results found.  Procedures Procedures    Medications Ordered in ED Medications  sodium chloride 0.9 % bolus 1,000 mL (has no administration in time range)  metoCLOPramide (REGLAN) injection 5 mg (has no administration in time range)  diphenhydrAMINE (BENADRYL) injection 25 mg (has no administration in time range)    ED Course/ Medical Decision Making/ A&P    Patient seen and examined. History obtained directly from patient and family at bedside who gives additional history details.   Labs/EKG: Ordered CBC, CMP, UA.  Imaging: None ordered  Medications/Fluids: Ordered: IV Reglan and Benadryl for nausea.  IV fluid bolus.  Most recent vital signs reviewed and are as follows: BP 107/71 (BP Location: Left Arm)   Pulse 95   Temp 98.5 F (36.9 C) (Oral)   Resp 16   Ht 5' (1.524 m)   Wt 61.2 kg   LMP 11/10/2021   SpO2 100%   BMI 26.37 kg/m   Initial impression: Nausea and vomiting in first trimester pregnancy.  12:42 PM notified of 2 unsuccessful attempts at IV access on patient.  Will attempt to give oral antiemetic and hydrate by mouth.  As patient is not actively vomiting, feel this is reasonable.  Will reassess.    4:44 PM Patient improved and passed p.o. challenge after oral Reglan.  Reviewed and interpreted CMP which was unremarkable, UA without infection.  Does have 40 ketones suggesting mild dehydration.  Reassessment performed. Patient appears stable and comfortable.  Reviewed pertinent lab work and imaging with patient at bedside. Questions answered.   Most current vital signs reviewed and are as follows: BP  95/66   Pulse 84   Temp 98.5 F (36.9 C) (Oral)   Resp 15   Ht 5' (1.524 m)   Wt 61.2 kg   LMP 11/10/2021   SpO2 99%   BMI 26.37 kg/m   Plan: Discharge to home.   Prescriptions written for: Reglan.  Also discussed use of doxylamine/B6 for nausea and vomiting as well.  Other home care instructions discussed: Maintaining good hydration  ED return instructions discussed: The patient was urged to return to the Emergency Department immediately with worsening of current symptoms, worsening abdominal pain, persistent vomiting, blood noted in  stools, fever, or any other concerns. The patient verbalized understanding.   Follow-up instructions discussed: Patient encouraged to follow-up with their PCP in 3 days.                           Medical Decision Making Amount and/or Complexity of Data Reviewed Labs: ordered.  Risk Prescription drug management.   Patient with nausea and vomiting, some diarrhea in first trimester pregnancy.  Considered possibility of viral gastroenteritis.  I also considered hyperemesis related to pregnancy, although she has been doing fairly well early in her pregnancy in regards to this.  No abdominal symptoms.  She has had reportedly normal ultrasound and I do not suspect ectopic pregnancy or impending miscarriage.  Unable to obtain CBC today but electrolytes look good.  The patient's vital signs, pertinent lab work and imaging were reviewed and interpreted as discussed in the ED course. Hospitalization was considered for further testing, treatments, or serial exams/observation. However as patient is well-appearing, has a stable exam, and reassuring studies today, I do not feel that they warrant admission at this time. This plan was discussed with the patient who verbalizes agreement and comfort with this plan and seems reliable and able to return to the Emergency Department with worsening or changing symptoms.             Final Clinical Impression(s) / ED  Diagnoses Final diagnoses:  Nausea and vomiting in pregnancy    Rx / DC Orders ED Discharge Orders          Ordered    metoCLOPramide (REGLAN) 10 MG tablet  Every 6 hours        02/07/22 1426              Renne Crigler, PA-C 02/07/22 1646    Cheryll Cockayne, MD 02/26/22 2156

## 2022-02-07 NOTE — ED Notes (Signed)
Pt tolerating PO fluids

## 2022-04-02 ENCOUNTER — Other Ambulatory Visit: Payer: Self-pay | Admitting: Obstetrics & Gynecology

## 2022-04-02 DIAGNOSIS — Z3689 Encounter for other specified antenatal screening: Secondary | ICD-10-CM
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
## 2022-04-12 ENCOUNTER — Encounter: Payer: Self-pay | Admitting: *Deleted

## 2022-04-19 ENCOUNTER — Ambulatory Visit: Payer: Managed Care, Other (non HMO)

## 2022-04-19 ENCOUNTER — Other Ambulatory Visit: Payer: Managed Care, Other (non HMO)

## 2022-05-07 ENCOUNTER — Encounter: Payer: Self-pay | Admitting: *Deleted

## 2022-05-07 ENCOUNTER — Ambulatory Visit: Payer: Managed Care, Other (non HMO) | Attending: Obstetrics and Gynecology | Admitting: *Deleted

## 2022-05-07 ENCOUNTER — Ambulatory Visit (HOSPITAL_BASED_OUTPATIENT_CLINIC_OR_DEPARTMENT_OTHER): Payer: Managed Care, Other (non HMO)

## 2022-05-07 ENCOUNTER — Ambulatory Visit (HOSPITAL_BASED_OUTPATIENT_CLINIC_OR_DEPARTMENT_OTHER): Payer: Managed Care, Other (non HMO) | Admitting: Obstetrics and Gynecology

## 2022-05-07 VITALS — BP 97/67 | HR 84

## 2022-05-07 DIAGNOSIS — O283 Abnormal ultrasonic finding on antenatal screening of mother: Secondary | ICD-10-CM

## 2022-05-07 DIAGNOSIS — Z3689 Encounter for other specified antenatal screening: Secondary | ICD-10-CM

## 2022-05-07 DIAGNOSIS — Z3A25 25 weeks gestation of pregnancy: Secondary | ICD-10-CM | POA: Diagnosis present

## 2022-05-07 DIAGNOSIS — Z363 Encounter for antenatal screening for malformations: Secondary | ICD-10-CM | POA: Diagnosis present

## 2022-05-07 NOTE — Progress Notes (Signed)
Maternal-Fetal Medicine    Name: Sara Mueller DOB: [birth year] MRN: [id number] Referring Provider: Hoover Browns, MD  I had the pleasure of seeing [name] [facility]. She is G1 P0 at 25w 3d gestation and is here for ultrasound and consultation. At your office ultrasound bilateral echogenic intracardiac foci were seen.  On cell-free fetal DNA screening, the risks of fetal aneuploidies are not increased.  Patient reports no chronic medical conditions.  Ultrasound We performed fetal anatomical survey.  Amniotic fluid is normal and good fetal activity seen.  Fetal growth is appropriate for gestational age.  Bilateral echogenic intracardiac foci was seen.  No other markers of aneuploidies or fetal structural defects are seen.  Echogenic intracardiac focus (EIF) I counseled the couple that echogenic intracardiac focus is seen in 3% to 4% of normal fetuses.  It is a marker for Down syndrome.  Given that she has low risk for Down syndrome, this finding should be considered a normal variant.  Echogenic intracardiac focus is not associated with any structural heart malformations.  I reassured her that risk is not increased if EIF is seen in both ventricles.  I informed the patient that only amniocentesis will give a definitive result on the fetal karyotype I explained the procedure and possible complication of miscarriage (1 and 500 procedures). I did not recommend amniocentesis for this finding.  After counseling, the patient opted not to have amniocentesis.  Recommendations -Follow-up scans as clinically indicated.  Thank you for consultation.  If you have any questions or concerns, please contact me the [facility].  Consultation including face-to-face (more than 50%) counseling 30 minutes.

## 2022-05-24 LAB — GLUCOSE, 1 HOUR: Glucose 1 Hour: 82

## 2022-05-24 LAB — OB RESULTS CONSOLE RPR: RPR: NONREACTIVE

## 2022-05-24 LAB — OB RESULTS CONSOLE HIV ANTIBODY (ROUTINE TESTING): HIV: NONREACTIVE

## 2022-05-24 LAB — OB RESULTS CONSOLE HGB/HCT, BLOOD
HCT: 31 (ref 29–41)
Hemoglobin: 9.9

## 2022-05-24 LAB — OB RESULTS CONSOLE PLATELET COUNT: Platelets: 199

## 2022-06-20 ENCOUNTER — Inpatient Hospital Stay (HOSPITAL_COMMUNITY)
Admission: AD | Admit: 2022-06-20 | Discharge: 2022-06-21 | Disposition: A | Discharge status: Home / Self Care | Payer: Managed Care, Other (non HMO) | Attending: Obstetrics and Gynecology | Admitting: Obstetrics and Gynecology

## 2022-06-20 ENCOUNTER — Emergency Department (HOSPITAL_BASED_OUTPATIENT_CLINIC_OR_DEPARTMENT_OTHER)
Admission: EM | Admit: 2022-06-20 | Discharge: 2022-06-20 | Discharge status: Against Medical Advice | Payer: Managed Care, Other (non HMO) | Source: Home / Self Care

## 2022-06-20 ENCOUNTER — Other Ambulatory Visit: Payer: Self-pay

## 2022-06-20 ENCOUNTER — Encounter (HOSPITAL_COMMUNITY): Payer: Self-pay | Admitting: Obstetrics and Gynecology

## 2022-06-20 DIAGNOSIS — O9A213 Injury, poisoning and certain other consequences of external causes complicating pregnancy, third trimester: Secondary | ICD-10-CM | POA: Insufficient documentation

## 2022-06-20 DIAGNOSIS — Z3A31 31 weeks gestation of pregnancy: Secondary | ICD-10-CM | POA: Insufficient documentation

## 2022-06-20 DIAGNOSIS — S3991XA Unspecified injury of abdomen, initial encounter: Secondary | ICD-10-CM | POA: Insufficient documentation

## 2022-06-20 LAB — URINALYSIS, ROUTINE W REFLEX MICROSCOPIC
Bilirubin Urine: NEGATIVE
Glucose, UA: NEGATIVE mg/dL
Hgb urine dipstick: NEGATIVE
Ketones, ur: NEGATIVE mg/dL
Nitrite: NEGATIVE
Protein, ur: NEGATIVE mg/dL
Specific Gravity, Urine: 1.008 (ref 1.005–1.030)
pH: 7 (ref 5.0–8.0)
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
## 2022-06-21 ENCOUNTER — Inpatient Hospital Stay (HOSPITAL_COMMUNITY): Payer: Managed Care, Other (non HMO)

## 2022-06-21 DIAGNOSIS — Z3A31 31 weeks gestation of pregnancy: Secondary | ICD-10-CM | POA: Diagnosis not present

## 2022-06-21 DIAGNOSIS — S3991XA Unspecified injury of abdomen, initial encounter: Secondary | ICD-10-CM | POA: Diagnosis not present

## 2022-06-21 DIAGNOSIS — O9A213 Injury, poisoning and certain other consequences of external causes complicating pregnancy, third trimester: Secondary | ICD-10-CM

## 2022-06-21 DIAGNOSIS — R109 Unspecified abdominal pain: Secondary | ICD-10-CM | POA: Diagnosis present

## 2022-07-08 ENCOUNTER — Encounter: Payer: Self-pay | Admitting: Family Medicine

## 2022-07-08 ENCOUNTER — Other Ambulatory Visit: Payer: Self-pay

## 2022-07-08 ENCOUNTER — Other Ambulatory Visit (HOSPITAL_COMMUNITY)
Admission: RE | Admit: 2022-07-08 | Discharge: 2022-07-08 | Disposition: A | Discharge status: Home / Self Care | Payer: Managed Care, Other (non HMO) | Source: Ambulatory Visit | Attending: Family Medicine | Admitting: Family Medicine

## 2022-07-08 ENCOUNTER — Ambulatory Visit (INDEPENDENT_AMBULATORY_CARE_PROVIDER_SITE_OTHER): Payer: Managed Care, Other (non HMO) | Admitting: Family Medicine

## 2022-07-08 VITALS — BP 98/62 | HR 96

## 2022-07-08 DIAGNOSIS — Z3403 Encounter for supervision of normal first pregnancy, third trimester: Secondary | ICD-10-CM | POA: Diagnosis not present

## 2022-07-08 DIAGNOSIS — Z23 Encounter for immunization: Secondary | ICD-10-CM

## 2022-07-08 DIAGNOSIS — O26893 Other specified pregnancy related conditions, third trimester: Secondary | ICD-10-CM | POA: Insufficient documentation

## 2022-07-08 DIAGNOSIS — N898 Other specified noninflammatory disorders of vagina: Secondary | ICD-10-CM | POA: Diagnosis present

## 2022-07-08 DIAGNOSIS — O99013 Anemia complicating pregnancy, third trimester: Secondary | ICD-10-CM | POA: Diagnosis not present

## 2022-07-08 DIAGNOSIS — O283 Abnormal ultrasonic finding on antenatal screening of mother: Secondary | ICD-10-CM | POA: Insufficient documentation

## 2022-07-08 DIAGNOSIS — Z3A34 34 weeks gestation of pregnancy: Secondary | ICD-10-CM

## 2022-07-08 MED ORDER — FERROUS SULFATE 325 (65 FE) MG PO TBEC: 325.0000 mg | DELAYED_RELEASE_TABLET | ORAL | 3 refills | Status: DC

## 2022-07-08 MED ORDER — TERCONAZOLE 0.4 % VA CREA: 1.0000 | TOPICAL_CREAM | Freq: Every day | VAGINAL | 0 refills | Status: AC
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
## 2022-07-09 ENCOUNTER — Encounter: Payer: Self-pay | Admitting: *Deleted

## 2022-07-09 LAB — CERVICOVAGINAL ANCILLARY ONLY
Bacterial Vaginitis (gardnerella): NEGATIVE
Candida Glabrata: NEGATIVE
Candida Vaginitis: POSITIVE — AB
Comment: NEGATIVE
Comment: NEGATIVE
Comment: NEGATIVE
Comment: NEGATIVE
Trichomonas: NEGATIVE

## 2022-07-16 ENCOUNTER — Ambulatory Visit: Payer: Managed Care, Other (non HMO) | Attending: Family Medicine

## 2022-07-16 ENCOUNTER — Ambulatory Visit: Payer: Managed Care, Other (non HMO) | Admitting: *Deleted

## 2022-07-16 ENCOUNTER — Encounter: Payer: Self-pay | Admitting: *Deleted

## 2022-07-16 DIAGNOSIS — Z8279 Family history of other congenital malformations, deformations and chromosomal abnormalities: Secondary | ICD-10-CM | POA: Diagnosis not present

## 2022-07-16 DIAGNOSIS — Z3A35 35 weeks gestation of pregnancy: Secondary | ICD-10-CM

## 2022-07-16 DIAGNOSIS — O35BXX0 Maternal care for other (suspected) fetal abnormality and damage, fetal cardiac anomalies, not applicable or unspecified: Secondary | ICD-10-CM

## 2022-07-16 DIAGNOSIS — O283 Abnormal ultrasonic finding on antenatal screening of mother: Secondary | ICD-10-CM | POA: Diagnosis present

## 2022-07-21 ENCOUNTER — Ambulatory Visit (INDEPENDENT_AMBULATORY_CARE_PROVIDER_SITE_OTHER): Payer: Managed Care, Other (non HMO) | Admitting: Certified Nurse Midwife

## 2022-07-21 ENCOUNTER — Other Ambulatory Visit (HOSPITAL_COMMUNITY)
Admission: RE | Admit: 2022-07-21 | Discharge: 2022-07-21 | Disposition: A | Discharge status: Home / Self Care | Payer: Managed Care, Other (non HMO) | Source: Ambulatory Visit | Attending: Certified Nurse Midwife | Admitting: Certified Nurse Midwife

## 2022-07-21 ENCOUNTER — Other Ambulatory Visit: Payer: Self-pay

## 2022-07-21 VITALS — BP 110/65 | HR 93 | Wt 176.5 lb

## 2022-07-21 DIAGNOSIS — Z369 Encounter for antenatal screening, unspecified: Secondary | ICD-10-CM | POA: Diagnosis not present

## 2022-07-21 DIAGNOSIS — Z3A36 36 weeks gestation of pregnancy: Secondary | ICD-10-CM

## 2022-07-21 DIAGNOSIS — Z3493 Encounter for supervision of normal pregnancy, unspecified, third trimester: Secondary | ICD-10-CM

## 2022-07-22 ENCOUNTER — Encounter: Payer: Self-pay | Admitting: Certified Nurse Midwife
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
## 2022-07-23 LAB — CERVICOVAGINAL ANCILLARY ONLY
Chlamydia: NEGATIVE
Comment: NEGATIVE
Comment: NORMAL
Neisseria Gonorrhea: NEGATIVE

## 2022-07-26 LAB — CULTURE, BETA STREP (GROUP B ONLY): Strep Gp B Culture: NEGATIVE

## 2022-07-28 ENCOUNTER — Other Ambulatory Visit: Payer: Self-pay

## 2022-07-28 ENCOUNTER — Ambulatory Visit (INDEPENDENT_AMBULATORY_CARE_PROVIDER_SITE_OTHER): Payer: Managed Care, Other (non HMO) | Admitting: Certified Nurse Midwife

## 2022-07-28 VITALS — BP 116/64 | HR 94 | Wt 181.6 lb

## 2022-07-28 DIAGNOSIS — Z3493 Encounter for supervision of normal pregnancy, unspecified, third trimester: Secondary | ICD-10-CM

## 2022-07-28 DIAGNOSIS — R102 Pelvic and perineal pain: Secondary | ICD-10-CM

## 2022-07-28 DIAGNOSIS — Z3A37 37 weeks gestation of pregnancy: Secondary | ICD-10-CM

## 2022-07-28 MED ORDER — CYCLOBENZAPRINE HCL 10 MG PO TABS: 10.0000 mg | ORAL_TABLET | Freq: Three times a day (TID) | ORAL | 1 refills | Status: AC | PRN

## 2022-07-28 NOTE — Progress Notes (Signed)
   PRENATAL VISIT NOTE  Subjective:  Sara Mueller is a 21 y.o. G1P0 at [redacted]w[redacted]d being seen today for ongoing prenatal care.  She is currently monitored for the following issues for this low-risk pregnancy and has Headache due to trauma; Asthma, mild persistent; Health maintenance examination; Encounter for supervision of normal first pregnancy in third trimester; Echogenic intracardiac focus of fetus on prenatal ultrasound; Allergic rhinitis; and MDD (major depressive disorder) on their problem list.  Patient reports  pelvic pain - started in the middle of her pelvis and so she began doing a lot of stretches to help open her pelvis. Now very sore and across the whole front/back, aggravated by movement. Denies cramping or bleeding .  Contractions: Not present. Vag. Bleeding: None.  Movement: Present. Denies leaking of fluid.   The following portions of the patient's history were reviewed and updated as appropriate: allergies, current medications, past family history, past medical history, past social history, past surgical history and problem list.   Objective:   Vitals:   07/28/22 1320  BP: 116/64  Pulse: 94  Weight: 181 lb 9.6 oz (82.4 kg)   Fetal Status: Fetal Heart Rate (bpm): 142 Fundal Height: 36 cm Movement: Present  Presentation: Vertex  General:  Alert, oriented and cooperative. Patient is in no acute distress.  Skin: Skin is warm and dry. No rash noted.   Cardiovascular: Normal heart rate noted  Respiratory: Normal respiratory effort, no problems with respiration noted  Abdomen: Soft, gravid, appropriate for gestational age.  Pain/Pressure: Present     Pelvic: Cervical exam deferred        Extremities: Normal range of motion.  Edema: Trace  Mental Status: Normal mood and affect. Normal behavior. Normal judgment and thought content.   Assessment and Plan:  Pregnancy: G1P0 at [redacted]w[redacted]d 1. Encounter for supervision of low-risk pregnancy in third trimester - Doing well, feeling regular  and vigorous fetal movement   2. [redacted] weeks gestation of pregnancy - Routine OB care  - GBS negative  3. Pelvic pain - Did not collect swabs, was tested at last visit with negative swab and no increase in discharge/odor. - Advised to stop doing pelvic opening stretches but to concentrate on helping baby get into the pelvis with back stretches (knees together). Also advised her to treat this like a muscle injury and to focus on supporting her belly by wearing a maternity band at work, resting with knees together, warm baths with Epsom salt and flexeril at night. Pt amenable to plan.  - cyclobenzaprine (FLEXERIL) 10 MG tablet; Take 1 tablet (10 mg total) by mouth every 8 (eight) hours as needed for muscle spasms.  Dispense: 30 tablet; Refill: 1  Term labor symptoms and general obstetric precautions including but not limited to vaginal bleeding, contractions, leaking of fluid and fetal movement were reviewed in detail with the patient. Please refer to After Visit Summary for other counseling recommendations.   Return in about 1 week (around 08/04/2022) for IN-PERSON, LOB.  Future Appointments  Date Time Provider [place]  08/04/2022  1:15 PM Helaine Chess [facility]  08/11/2022  1:15 PM Helaine Chess [facility]  08/18/2022  2:15 PM Caren Macadam, MD [facility]  08/18/2022  3:15 PM [facility]    Gabriel Carina, CNM

## 2022-08-04 ENCOUNTER — Ambulatory Visit (INDEPENDENT_AMBULATORY_CARE_PROVIDER_SITE_OTHER): Payer: Managed Care, Other (non HMO) | Admitting: Certified Nurse Midwife

## 2022-08-04 ENCOUNTER — Other Ambulatory Visit: Payer: Self-pay

## 2022-08-04 VITALS — BP 131/82 | HR 96 | Wt 186.1 lb

## 2022-08-04 DIAGNOSIS — Z3A38 38 weeks gestation of pregnancy: Secondary | ICD-10-CM

## 2022-08-04 DIAGNOSIS — Z3493 Encounter for supervision of normal pregnancy, unspecified, third trimester: Secondary | ICD-10-CM

## 2022-08-04 NOTE — Progress Notes (Signed)
   PRENATAL VISIT NOTE  Subjective:  Sara Mueller is a 21 y.o. G1P0 at [redacted]w[redacted]d being seen today for ongoing prenatal care.  She is currently monitored for the following issues for this low-risk pregnancy and has Headache due to trauma; Asthma, mild persistent; Health maintenance examination; Encounter for supervision of normal first pregnancy in third trimester; Echogenic intracardiac focus of fetus on prenatal ultrasound; Allergic rhinitis; and MDD (major depressive disorder) on their problem list.  Patient reports  swelling of her hands and ankles but it resolves .  Contractions: Not present. Vag. Bleeding: None.  Movement: Present. Denies leaking of fluid.   The following portions of the patient's history were reviewed and updated as appropriate: allergies, current medications, past family history, past medical history, past social history, past surgical history and problem list.   Objective:   Vitals:   08/04/22 1333  BP: 131/82  Pulse: 96  Weight: 186 lb 1.6 oz (84.4 kg)   Fetal Status: Fetal Heart Rate (bpm): 146   Movement: Present     General:  Alert, oriented and cooperative. Patient is in no acute distress.  Skin: Skin is warm and dry. No rash noted.   Cardiovascular: Normal heart rate noted  Respiratory: Normal respiratory effort, no problems with respiration noted  Abdomen: Soft, gravid, appropriate for gestational age.  Pain/Pressure: Present     Pelvic: Cervical exam deferred        Extremities: Normal range of motion.  Edema: Trace  Mental Status: Normal mood and affect. Normal behavior. Normal judgment and thought content.   Assessment and Plan:  Pregnancy: G1P0 at [redacted]w[redacted]d 1. Encounter for supervision of low-risk pregnancy in third trimester - Doing well, feeling regular and vigorous fetal movement   2. [redacted] weeks gestation of pregnancy - Routine OB care   Term labor symptoms and general obstetric precautions including but not limited to vaginal bleeding, contractions,  leaking of fluid and fetal movement were reviewed in detail with the patient. Please refer to After Visit Summary for other counseling recommendations.   Return in about 1 week (around 08/11/2022) for IN-PERSON, LOB.  Future Appointments  Date Time Provider [place]  08/11/2022  1:15 PM Helaine Chess [facility]  08/18/2022  2:15 PM Caren Macadam, MD [facility]  08/18/2022  3:15 PM [facility]    Gabriel Carina, CNM

## 2022-08-09 ENCOUNTER — Telehealth (HOSPITAL_COMMUNITY): Payer: Self-pay

## 2022-08-11 ENCOUNTER — Ambulatory Visit (INDEPENDENT_AMBULATORY_CARE_PROVIDER_SITE_OTHER): Payer: Managed Care, Other (non HMO) | Admitting: Family Medicine

## 2022-08-11 ENCOUNTER — Other Ambulatory Visit (HOSPITAL_COMMUNITY)
Admission: RE | Admit: 2022-08-11 | Discharge: 2022-08-11 | Disposition: A | Discharge status: Home / Self Care | Payer: Managed Care, Other (non HMO) | Source: Ambulatory Visit | Attending: Certified Nurse Midwife | Admitting: Certified Nurse Midwife

## 2022-08-11 ENCOUNTER — Other Ambulatory Visit: Payer: Self-pay

## 2022-08-11 ENCOUNTER — Encounter: Payer: Self-pay | Admitting: Family Medicine

## 2022-08-11 VITALS — BP 119/76 | HR 93 | Wt 181.6 lb

## 2022-08-11 DIAGNOSIS — O26893 Other specified pregnancy related conditions, third trimester: Secondary | ICD-10-CM

## 2022-08-11 DIAGNOSIS — N898 Other specified noninflammatory disorders of vagina: Secondary | ICD-10-CM | POA: Insufficient documentation

## 2022-08-11 DIAGNOSIS — Z3A39 39 weeks gestation of pregnancy: Secondary | ICD-10-CM

## 2022-08-11 DIAGNOSIS — Z3403 Encounter for supervision of normal first pregnancy, third trimester: Secondary | ICD-10-CM

## 2022-08-11 NOTE — Progress Notes (Signed)
   PRENATAL VISIT NOTE  Subjective:  Sara Mueller is a 21 y.o. G1P0 at [redacted]w[redacted]d being seen today for ongoing prenatal care.  She is currently monitored for the following issues for this low-risk pregnancy and has Headache due to trauma; Asthma, mild persistent; Health maintenance examination; Encounter for supervision of normal first pregnancy in third trimester; Echogenic intracardiac focus of fetus on prenatal ultrasound; Allergic rhinitis; and MDD (major depressive disorder) on their problem list.  Patient reports  watery discharge that is causing "puddles" in her underwear several times a day. It is clear .  Contractions: Irritability. Vag. Bleeding: None.  Movement: Present. Denies leaking of fluid.   The following portions of the patient's history were reviewed and updated as appropriate: allergies, current medications, past family history, past medical history, past social history, past surgical history and problem list.   Objective:   Vitals:   08/11/22 1052  BP: 119/76  Pulse: 93  Weight: 181 lb 9.6 oz (82.4 kg)    Fetal Status: Fetal Heart Rate (bpm): 144   Movement: Present     General:  Alert, oriented and cooperative. Patient is in no acute distress.  Skin: Skin is warm and dry. No rash noted.   Cardiovascular: Normal heart rate noted  Respiratory: Normal respiratory effort, no problems with respiration noted  Abdomen: Soft, gravid, appropriate for gestational age.  Pain/Pressure: Present     Pelvic: Cervical exam deferred        Extremities: Normal range of motion.  Edema: Trace  Mental Status: Normal mood and affect. Normal behavior. Normal judgment and thought content.   Assessment and Plan:  Pregnancy: G1P0 at [redacted]w[redacted]d 1. Encounter for supervision of normal first pregnancy in third trimester Having significant watery discharge. Fern negative. Wet prep pending.   2. [redacted] weeks gestation of pregnancy Plan for induction at 41 weeks unless indicated before.  - US FETAL BPP  W/NONSTRESS; Future  3. Vaginal discharge during pregnancy in third trimester Fern negative today. - Cervicovaginal ancillary only( No Name)  Term labor symptoms and general obstetric precautions including but not limited to vaginal bleeding, contractions, leaking of fluid and fetal movement were reviewed in detail with the patient. Please refer to After Visit Summary for other counseling recommendations.   No follow-ups on file.  Future Appointments  Date Time Provider [place]  08/18/2022  2:15 PM Caren Macadam, MD [facility]  08/18/2022  3:15 PM [facility]    Aniyha Tate Autry-Lott, DO

## 2022-08-12 LAB — CERVICOVAGINAL ANCILLARY ONLY
Bacterial Vaginitis (gardnerella): NEGATIVE
Candida Glabrata: NEGATIVE
Candida Vaginitis: POSITIVE — AB
Chlamydia: NEGATIVE
Comment: NEGATIVE
Comment: NEGATIVE
Comment: NEGATIVE
Comment: NEGATIVE
Comment: NEGATIVE
Comment: NORMAL
Neisseria Gonorrhea: NEGATIVE
Trichomonas: NEGATIVE

## 2022-08-15 ENCOUNTER — Encounter: Payer: Self-pay | Admitting: Certified Nurse Midwife

## 2022-08-15 ENCOUNTER — Encounter: Payer: Self-pay | Admitting: Family Medicine

## 2022-08-16 ENCOUNTER — Inpatient Hospital Stay (HOSPITAL_COMMUNITY)
Admission: AD | Admit: 2022-08-16 | Discharge: 2022-08-16 | Disposition: A | Discharge status: Home / Self Care | Payer: Managed Care, Other (non HMO) | Attending: Obstetrics and Gynecology | Admitting: Obstetrics and Gynecology

## 2022-08-16 ENCOUNTER — Telehealth: Payer: Self-pay | Admitting: Family Medicine

## 2022-08-16 ENCOUNTER — Other Ambulatory Visit: Payer: Self-pay | Admitting: Family Medicine

## 2022-08-16 ENCOUNTER — Other Ambulatory Visit: Payer: Self-pay

## 2022-08-16 DIAGNOSIS — Z3A40 40 weeks gestation of pregnancy: Secondary | ICD-10-CM | POA: Diagnosis not present

## 2022-08-16 DIAGNOSIS — Z3A39 39 weeks gestation of pregnancy: Secondary | ICD-10-CM | POA: Insufficient documentation

## 2022-08-16 DIAGNOSIS — O26893 Other specified pregnancy related conditions, third trimester: Secondary | ICD-10-CM | POA: Insufficient documentation

## 2022-08-16 DIAGNOSIS — O1203 Gestational edema, third trimester: Secondary | ICD-10-CM | POA: Insufficient documentation

## 2022-08-16 DIAGNOSIS — O368130 Decreased fetal movements, third trimester, not applicable or unspecified: Secondary | ICD-10-CM | POA: Diagnosis not present

## 2022-08-16 DIAGNOSIS — O479 False labor, unspecified: Secondary | ICD-10-CM | POA: Diagnosis not present

## 2022-08-16 DIAGNOSIS — O471 False labor at or after 37 completed weeks of gestation: Secondary | ICD-10-CM | POA: Diagnosis not present

## 2022-08-16 DIAGNOSIS — R202 Paresthesia of skin: Secondary | ICD-10-CM

## 2022-08-16 MED ORDER — TERCONAZOLE 0.8 % VA CREA: 1.0000 | TOPICAL_CREAM | Freq: Every day | VAGINAL | 0 refills | Status: AC

## 2022-08-16 NOTE — Telephone Encounter (Signed)
I called pt and discussed her concerns.  She stated that she began having numbness and tingling sensation in her legs on 11/4. This feeling is making it difficult to walk. Pt also states that her Rt lower leg is swollen and is larger than her Lt. I advised pt that I cannot advise her over the phone for this problem. She needs in person evaluation and needs to go to MAU today. She voiced understanding and agreed to recommendation.

## 2022-08-16 NOTE — Telephone Encounter (Signed)
Patient called in saying she is experiencing pain, tingling, and numbness in her legs to where she is unable to walk. she also said her right leg is significantly larger than the left. She stated when she tries to walk it feels like pins and needles

## 2022-08-16 NOTE — MAU Provider Note (Signed)
None     [name] is a 21 y.o. G1P0 patient who presents to MAU today with complaint of paresthesias of the lower extremities bilaterally. This has been going on for a few days, worse with edema in her lower extremities. It's difficult to walk on due to the paresthesias. Good fetal movement. No pain in her calves.   O BP 117/62 (BP Location: Right Arm)   Pulse (!) 101   Temp 98.1 F (36.7 C) (Oral)   Resp 16   Ht 5\' 3"  (1.6 m)   Wt 85.4 kg   LMP 11/10/2021   SpO2 98% Comment: room air  BMI 33.34 kg/m  Physical Exam Vitals reviewed. Exam conducted with a chaperone present.  Constitutional:      Appearance: Normal appearance.  Cardiovascular:     Rate and Rhythm: Normal rate and regular rhythm.  Musculoskeletal:        General: No tenderness. Swelling: 2+.    Right lower leg: Edema present.     Left lower leg: Edema present.  Skin:    General: Skin is warm and dry.     Capillary Refill: Capillary refill takes less than 2 seconds.  Neurological:     General: No focal deficit present.     Mental Status: She is alert.  Psychiatric:        Mood and Affect: Mood normal.        Behavior: Behavior normal.        Thought Content: Thought content normal.        Judgment: Judgment normal.     A 1. [redacted] weeks gestation of pregnancy   2. Paresthesia of lower extremity     P Discharge from MAU in stable condition BP reassuring.  No concern for DVT.  Recommended compression stockings. Scheduled for induction next week. Warning signs for worsening condition that would warrant emergency follow-up discussed Patient may return to MAU as needed   Truett Mainland, DO 08/16/2022 4:27 PM

## 2022-08-16 NOTE — MAU Note (Signed)
Sara Mueller is a 21 y.o. at [redacted]w[redacted]d here in MAU reporting: has been having swelling in bilateral legs, worse in the right leg. Since Friday has been having tingling and pins/needles in both her legs. No labor s/s. +FM  Onset of complaint: ongoing  Pain score: 7/10  Vitals:   08/16/22 1611  BP: 117/62  Pulse: (!) 101  Resp: 16  Temp: 98.1 F (36.7 C)  SpO2: 98%     FHT:+FM, doppler deferred due to maternal apparel  Lab orders placed from triage: UA

## 2022-08-17 ENCOUNTER — Encounter (HOSPITAL_COMMUNITY): Payer: Self-pay | Admitting: Obstetrics and Gynecology

## 2022-08-17 ENCOUNTER — Inpatient Hospital Stay (HOSPITAL_COMMUNITY)
Admission: AD | Admit: 2022-08-17 | Discharge: 2022-08-17 | Disposition: A | Discharge status: Home / Self Care | Payer: Managed Care, Other (non HMO) | Source: Home / Self Care | Attending: Obstetrics and Gynecology | Admitting: Obstetrics and Gynecology

## 2022-08-17 DIAGNOSIS — O471 False labor at or after 37 completed weeks of gestation: Secondary | ICD-10-CM

## 2022-08-17 DIAGNOSIS — Z3A40 40 weeks gestation of pregnancy: Secondary | ICD-10-CM

## 2022-08-17 DIAGNOSIS — Z3689 Encounter for other specified antenatal screening: Secondary | ICD-10-CM | POA: Insufficient documentation

## 2022-08-17 NOTE — Discharge Instructions (Signed)
The [employer] This circuit takes at least 90 minutes to complete so clear your schedule and make mental preparations so you can relax in your environment.  The second step requires a lot of pillows so gather them up before beginning.  Before starting, you should empty your bladder! Have a nice drink nearby, and make sure it has a straw! If you are having contractions, this circuit should be done through contractions, try not to change positions between steps.  Step One: Open-kneeChest Stay in this position for 30 minutes, start in cat/cow, then drop your chest as low as you can to the bed or the floor and your bottom as high as you can. Knees should be fairly wide apart, and the angle between the torso/thighs should be wider than 90 degrees. Wiggle around, prop with lots of pillows and use this time to get totally relaxed. This position allows the baby to scoot out of the pelvis a bit and gives them room to rotate, shift their head position, etc. If the pregnant person finds it helpful, careful positioning with a rebozo under the belly, with gentle tension from a support person behind can help maintain this position for the full 30 minutes.  Step Two:ExaggeratedLeft SideLying Roll to your left side, bringing your top leg as high as possible and keeping your bottom leg straight. Roll forward as much as possible, again using a lot of pillows. Sink into the bed and relax some more. If you fall asleep, that's totally okay and you can stay there! If not, stay here for at least another half an hour. Try and get your top right leg up towards your head and get as rolled over onto your belly as much as possible. If you repeat the circuit during labor, try alternating left and right sides. We know the photo the left is actually right side... just flip the image in your head.  Step Three: Moving and Lunges Lunge, walk stairs facing sideways, 2 at a time, (have a spotter  downstairs of you!), take a walk outside with one foot on the curb and the other on the street, sit on a birth ball and hula- anything that's upright and putting your pelvis in open, asymmetrical positions. Spend at least 30 minutes doing this one as well to give your baby a chance to move down. If you are lunging or stair or curb walking, you should lunge/walk/go up stairs in the direction that feels better to you. The key with the lunge is that the toes of the higher leg and mom's belly button should be at right angles. Do not lunge over your knee, that closes the pelvis.  You got this!!!!!!!!!!!!!!!!!!!!!!! :)  

## 2022-08-17 NOTE — MAU Provider Note (Signed)
None      S: [name] is a 21 y.o. G1P0 at [redacted]w[redacted]d  who presents to MAU today complaining contractions since 0030. She denies vaginal bleeding, but reports bloody show. She denies LOF. She reports normal fetal movement.  Nurse requests review of strip as patient requesting ambulation.  O: BP 125/79   Pulse 97   Temp 97.9 F (36.6 C) (Oral)   Resp 17   LMP 11/10/2021  GENERAL: Well-developed, well-nourished female in no acute distress.  HEAD: Normocephalic, atraumatic.  CHEST: Normal effort of breathing, regular heart rate ABDOMEN: Soft, nontender, gravid  Cervical exam:  Dilation: 1.5 Effacement (%): 50 Station: -3 Presentation: Vertex Exam by:: K. Cowher RN   Fetal Monitoring: Baseline: 140 Variability: Moderate Accelerations: Present Decelerations: None Contractions: Q3-71min   A: SIUP at [redacted]w[redacted]d  Contractions Cat I FT  P: NST Reactive Plan to recheck cervix in 2 hours Okay for intermittent monitoring.   Gavin Pound, CNM 08/17/2022 10:21 AM  Reassessment (12:14 PM)  -Nurse reports cervix remains unchanged -Discharge to home with precautions.  Maryann Conners MSN, CNM Advanced Practice Provider, [facility]

## 2022-08-17 NOTE — MAU Note (Signed)
Sara Mueller is a 21 y.o. at [redacted]w[redacted]d here in MAU reporting: ctx that started at 1200-0100 that woke her up from her sleep. Denies any LOF or VB. States there was bloody mucus when she wiped this morning. +FM   Pain score: 3 There were no vitals filed for this visit.   FHT:140 Lab orders placed from triage:

## 2022-08-18 ENCOUNTER — Other Ambulatory Visit: Payer: Self-pay

## 2022-08-18 ENCOUNTER — Inpatient Hospital Stay (HOSPITAL_COMMUNITY)
Admission: AD | Admit: 2022-08-18 | Discharge: 2022-08-18 | Disposition: A | Discharge status: Home / Self Care | Payer: Managed Care, Other (non HMO) | Attending: Obstetrics and Gynecology | Admitting: Obstetrics and Gynecology

## 2022-08-18 ENCOUNTER — Inpatient Hospital Stay (HOSPITAL_COMMUNITY)
Admission: AD | Admit: 2022-08-18 | Discharge: 2022-08-21 | DRG: 768 | Disposition: A | Discharge status: Home / Self Care | Payer: Managed Care, Other (non HMO) | Attending: Family Medicine | Admitting: Family Medicine

## 2022-08-18 ENCOUNTER — Encounter: Payer: Self-pay | Admitting: Family Medicine

## 2022-08-18 ENCOUNTER — Encounter (HOSPITAL_COMMUNITY): Payer: Self-pay | Admitting: Obstetrics and Gynecology

## 2022-08-18 ENCOUNTER — Inpatient Hospital Stay (HOSPITAL_COMMUNITY): Payer: Managed Care, Other (non HMO)

## 2022-08-18 ENCOUNTER — Encounter (HOSPITAL_COMMUNITY): Payer: Self-pay | Admitting: Family Medicine

## 2022-08-18 DIAGNOSIS — O479 False labor, unspecified: Secondary | ICD-10-CM | POA: Diagnosis not present

## 2022-08-18 DIAGNOSIS — O283 Abnormal ultrasonic finding on antenatal screening of mother: Secondary | ICD-10-CM | POA: Diagnosis present

## 2022-08-18 DIAGNOSIS — Z3403 Encounter for supervision of normal first pregnancy, third trimester: Secondary | ICD-10-CM | POA: Insufficient documentation

## 2022-08-18 DIAGNOSIS — Z3A40 40 weeks gestation of pregnancy: Secondary | ICD-10-CM

## 2022-08-18 DIAGNOSIS — Z3490 Encounter for supervision of normal pregnancy, unspecified, unspecified trimester: Secondary | ICD-10-CM

## 2022-08-18 DIAGNOSIS — O35BXX0 Maternal care for other (suspected) fetal abnormality and damage, fetal cardiac anomalies, not applicable or unspecified: Secondary | ICD-10-CM

## 2022-08-18 DIAGNOSIS — Z8279 Family history of other congenital malformations, deformations and chromosomal abnormalities: Secondary | ICD-10-CM

## 2022-08-18 DIAGNOSIS — F329 Major depressive disorder, single episode, unspecified: Secondary | ICD-10-CM | POA: Diagnosis present

## 2022-08-18 DIAGNOSIS — O411230 Chorioamnionitis, third trimester, not applicable or unspecified: Secondary | ICD-10-CM | POA: Diagnosis present

## 2022-08-18 DIAGNOSIS — O480 Post-term pregnancy: Secondary | ICD-10-CM | POA: Diagnosis present

## 2022-08-18 DIAGNOSIS — O368130 Decreased fetal movements, third trimester, not applicable or unspecified: Secondary | ICD-10-CM | POA: Diagnosis present

## 2022-08-18 DIAGNOSIS — O411231 Chorioamnionitis, third trimester, fetus 1: Secondary | ICD-10-CM | POA: Diagnosis not present

## 2022-08-18 DIAGNOSIS — Z8616 Personal history of COVID-19: Secondary | ICD-10-CM

## 2022-08-18 HISTORY — DX: Encounter for supervision of normal pregnancy, unspecified, unspecified trimester: Z34.90

## 2022-08-18 LAB — CBC
HCT: 31.5 % — ABNORMAL LOW (ref 36.0–46.0)
Hemoglobin: 10.3 g/dL — ABNORMAL LOW (ref 12.0–15.0)
MCH: 24.4 pg — ABNORMAL LOW (ref 26.0–34.0)
MCHC: 32.7 g/dL (ref 30.0–36.0)
MCV: 74.6 fL — ABNORMAL LOW (ref 80.0–100.0)
Platelets: 198 10*3/uL (ref 150–400)
RBC: 4.22 MIL/uL (ref 3.87–5.11)
RDW: 15.2 % (ref 11.5–15.5)
WBC: 23.4 10*3/uL — ABNORMAL HIGH (ref 4.0–10.5)
nRBC: 0 % (ref 0.0–0.2)

## 2022-08-18 LAB — POCT FERN TEST: POCT Fern Test: NEGATIVE

## 2022-08-18 MED ORDER — OXYCODONE-ACETAMINOPHEN 5-325 MG PO TABS
1.0000 | ORAL_TABLET | ORAL | Status: DC | PRN
  Administered 2022-08-19: 1 via ORAL
  Filled 2022-08-18: qty 1

## 2022-08-18 MED ORDER — TERBUTALINE SULFATE 1 MG/ML IJ SOLN
0.2500 mg | Freq: Once | INTRAMUSCULAR | Status: DC | PRN
  Filled 2022-08-18: qty 1

## 2022-08-18 MED ORDER — ONDANSETRON HCL 4 MG/2ML IJ SOLN: 4.0000 mg | Freq: Four times a day (QID) | INTRAMUSCULAR | Status: DC | PRN

## 2022-08-18 MED ORDER — OXYTOCIN-SODIUM CHLORIDE 30-0.9 UT/500ML-% IV SOLN: 1.0000 m[IU]/min | INTRAVENOUS | Status: DC

## 2022-08-18 MED ORDER — OXYTOCIN BOLUS FROM INFUSION
333.0000 mL | Freq: Once | INTRAVENOUS | Status: AC
  Administered 2022-08-19: 333 mL via INTRAVENOUS

## 2022-08-18 MED ORDER — LACTATED RINGERS IV SOLN
500.0000 mL | INTRAVENOUS | Status: DC | PRN
  Administered 2022-08-18: 500 mL via INTRAVENOUS

## 2022-08-18 MED ORDER — OXYTOCIN-SODIUM CHLORIDE 30-0.9 UT/500ML-% IV SOLN
2.5000 [IU]/h | INTRAVENOUS | Status: DC
  Filled 2022-08-18: qty 500

## 2022-08-18 MED ORDER — DIPHENHYDRAMINE HCL 50 MG/ML IJ SOLN
50.0000 mg | Freq: Once | INTRAMUSCULAR | Status: DC
  Filled 2022-08-18: qty 1

## 2022-08-18 MED ORDER — NALBUPHINE HCL 10 MG/ML IJ SOLN
10.0000 mg | INTRAMUSCULAR | Status: DC | PRN
  Administered 2022-08-18: 10 mg via INTRAVENOUS
  Filled 2022-08-18 (×2): qty 1

## 2022-08-18 MED ORDER — SOD CITRATE-CITRIC ACID 500-334 MG/5ML PO SOLN
30.0000 mL | ORAL | Status: DC | PRN
  Filled 2022-08-18: qty 30

## 2022-08-18 MED ORDER — OXYCODONE-ACETAMINOPHEN 5-325 MG PO TABS: 2.0000 | ORAL_TABLET | ORAL | Status: DC | PRN

## 2022-08-18 MED ORDER — ACETAMINOPHEN 325 MG PO TABS: 650.0000 mg | ORAL_TABLET | ORAL | Status: DC | PRN

## 2022-08-18 MED ORDER — LACTATED RINGERS IV SOLN: INTRAVENOUS | Status: DC

## 2022-08-18 MED ORDER — LIDOCAINE HCL (PF) 1 % IJ SOLN
30.0000 mL | INTRAMUSCULAR | Status: AC | PRN
  Administered 2022-08-19 (×2): 30 mL via SUBCUTANEOUS
  Filled 2022-08-18: qty 30

## 2022-08-18 NOTE — MAU Note (Signed)
.  Sara Mueller is a 20 y.o. at [redacted]w[redacted]d here in MAU reporting: ctx have increased in frequency since she was discharged earlier this morning. Denies LOF but does report bloody show. Scant amount of bloody show on pad with RN assessment. She also reports feeling less fetal movement since the ctx's picked up, she says that she can't really tell if she feels movement when the ctx come and they have been back to back. LMP: N/A Onset of complaint: On-going Pain score: 10/10 Vitals:   08/18/22 0914  BP: 128/83  Pulse: 97  Resp: 20  Temp: 98.3 F (36.8 C)  SpO2: 100%     FHT:135 Lab orders placed from triage:  MAU labor

## 2022-08-18 NOTE — Progress Notes (Addendum)
Patient ID: Sara Mueller, female   DOB: May 30, 2001, 21 y.o.   MRN: [id number]  Reviewed strip since admission which has remained Cat 1 w reg FM documented on the strip and per pt; nl bloody show; has been in a variety of positions; would like to get in the tub if possible (class certificate under media 07/22/22)  BPs 125/70, 128/76 EFM 135-140, +accels, no decels Ctx irreg 4-7 mins Cx deferred  IUP@40 .1wk Latent labor  -Discussed w nursing and will move to a room w a tub and sign consent -Expectant management  Sara Mueller CNM 08/18/2022 9:05 PM

## 2022-08-18 NOTE — H&P (Signed)
Sara Mueller is a 21 y.o. female presenting for IOL for Decreased fetal movement at [redacted]w[redacted]d.   OB History     Gravida  1   Para      Term      Preterm      AB      Living         SAB      IAB      Ectopic      Multiple      Live Births             Past Medical History:  Diagnosis Date   Constipation    COVID-19    Headache    Intermittent asthma    QVAR rx'd in the past for more persistent symptoms,?compliant?.   Ovarian cyst    Vitamin D deficiency    Past Surgical History:  Procedure Laterality Date   NO PAST SURGERIES     Family History: family history includes Asthma in her brother; Seizures in her brother. Social History:  reports that she has never smoked. She has never used smokeless tobacco. She reports that she does not drink alcohol and does not use drugs.     Maternal Diabetes: No Genetic Screening: Normal Maternal Ultrasounds/Referrals: Normal Fetal Ultrasounds or other Referrals:  Other: MFM ultrasound mild pyelectasis noted in the fetal kidney during prenatal ultrasound.  The baby will require a kidney ultrasound after delivery. Maternal Substance Abuse:  No Significant Maternal Medications:  None Significant Maternal Lab Results:  None and Group B Strep negative Number of Prenatal Visits:greater than 3 verified prenatal visits Other Comments:   @ 35 weeks Posterior placenta, AFI Normal, EFW 5#12oz 2595gm.   Review of Systems  Gastrointestinal:  Positive for abdominal pain.  Genitourinary:  Positive for vaginal bleeding.  Musculoskeletal:  Positive for back pain.  All other systems reviewed and are negative.  Maternal Medical History:  Reason for admission: Contractions and vaginal bleeding.   Contractions: Onset was 6-12 hours ago.   Frequency: regular.   Duration is approximately 8 minutes.   Perceived severity is mild.   Fetal activity: Perceived fetal activity is recently changed and decreased.   Last perceived fetal movement  was within the past 12 hours.   Prenatal Complications - Diabetes: none.   Dilation: 3 Effacement (%): 80 Station: -2 Exam by:: Georgina Snell, RN Blood pressure 128/76, pulse 86, temperature 98.3 F (36.8 C), temperature source Oral, resp. rate 20, height 5\' 3"  (1.6 m), last menstrual period 11/10/2021, SpO2 100 %.  Maternal Exam:  Uterine Assessment: Contraction strength is mild.  Contraction duration is 8 minutes. Contraction frequency is regular.  Abdomen: Patient reports no abdominal tenderness. Fetal presentation: vertex   Physical Exam Vitals and nursing note reviewed.  Constitutional:      General: She is not in acute distress.    Appearance: Normal appearance.  HENT:     Head: Normocephalic.  Pulmonary:     Effort: Pulmonary effort is normal.  Musculoskeletal:        General: Normal range of motion.     Cervical back: Normal range of motion.  Skin:    General: Skin is warm and dry.  Neurological:     Mental Status: She is alert and oriented to person, place, and time.  Psychiatric:        Mood and Affect: Mood normal.    Patient declines cervical exam at this time. Currently rates Pain a 10/10. Denies change in pain  from arrival to MAU. Patient previously desiring WB. Patient counseled about continuous monitoring d/t DFM. Patient verbalized understanding.   Prenatal labs: ABO, Rh: --/--/B POS (11/08 1344) Antibody: NEG (11/08 1344) Rubella: Immune (03/23 0000) RPR: Nonreactive (08/14 0000)  HBsAg: Negative (03/23 0000)  HIV: Non-reactive (08/14 0000)  GBS: Negative/-- (10/11 1606)   Assessment/Plan: IOL for DFM at [redacted]w[redacted]d.  - Patient spontaneously, regularly contracting q3-4 mins. Continue with expectant management at this time.  - FHT Cat I  - Patient desires to labor with support unmedicated. Patient may have Nitrous Oxide if desires.   - GBS Negative  - NVSD   Claudette Head, MSN CNM  08/18/2022, 5:39 PM

## 2022-08-18 NOTE — MAU Provider Note (Signed)
Sara Mueller is a 21 y.o. G1P0 female at [redacted]w[redacted]d  RN Labor check, not seen by provider. Here for 5+hrs, 2/60/-3 on arrival, 3/60/-3 1hr later and still the same 3 hours later. Contractions q 1-77mins. Wanting waterbirth/no pain meds.  SVE by RN: Dilation: 3 Effacement (%): 70 Station: -3 Exam by:: Felipa Furnace RN NST: FHR baseline 130 bpm, Variability: moderate, Accelerations:present, Decelerations:  Absent= Cat 1/Reactive Toco: irregular, every 1-8 minutes  D/C home, return when contractions closer/SROM, if vag bleeding or decreased FM. Keep prenatal appt later today if still not in active labor.   Cheral Marker CNM, [facility] 08/18/2022 7:06 AM

## 2022-08-18 NOTE — MAU Note (Signed)
Pt says UC strong since 2330. [facility]- clinic VE today-1-2 cm here- MAU  Denies HSV GBS- neg

## 2022-08-19 ENCOUNTER — Inpatient Hospital Stay (HOSPITAL_COMMUNITY): Payer: Managed Care, Other (non HMO) | Admitting: Anesthesiology

## 2022-08-19 DIAGNOSIS — O411231 Chorioamnionitis, third trimester, fetus 1: Secondary | ICD-10-CM

## 2022-08-19 DIAGNOSIS — Z3A40 40 weeks gestation of pregnancy: Secondary | ICD-10-CM

## 2022-08-19 LAB — TYPE AND SCREEN
ABO/RH(D): B POS
Antibody Screen: NEGATIVE

## 2022-08-19 LAB — RPR: RPR Ser Ql: NONREACTIVE

## 2022-08-19 MED ORDER — LIDOCAINE HCL (PF) 1 % IJ SOLN
INTRAMUSCULAR | Status: DC | PRN
  Administered 2022-08-19: 3 mL via EPIDURAL
  Administered 2022-08-19: 2 mL via EPIDURAL
  Administered 2022-08-19: 5 mL via EPIDURAL

## 2022-08-19 MED ORDER — LACTATED RINGERS IV SOLN
500.0000 mL | Freq: Once | INTRAVENOUS | Status: AC
  Administered 2022-08-19: 500 mL via INTRAVENOUS

## 2022-08-19 MED ORDER — ACETAMINOPHEN 10 MG/ML IV SOLN
INTRAVENOUS | Status: AC
  Filled 2022-08-19: qty 100

## 2022-08-19 MED ORDER — LIDOCAINE HCL (PF) 1 % IJ SOLN
INTRAMUSCULAR | Status: AC
  Filled 2022-08-19: qty 30

## 2022-08-19 MED ORDER — EPHEDRINE 5 MG/ML INJ: 10.0000 mg | INTRAVENOUS | Status: DC | PRN

## 2022-08-19 MED ORDER — PHENYLEPHRINE 80 MCG/ML (10ML) SYRINGE FOR IV PUSH (FOR BLOOD PRESSURE SUPPORT): 80.0000 ug | PREFILLED_SYRINGE | INTRAVENOUS | Status: DC | PRN

## 2022-08-19 MED ORDER — FENTANYL-BUPIVACAINE-NACL 0.5-0.125-0.9 MG/250ML-% EP SOLN
12.0000 mL/h | EPIDURAL | Status: DC | PRN
  Administered 2022-08-19: 12 mL/h via EPIDURAL
  Filled 2022-08-19: qty 250

## 2022-08-19 MED ORDER — ACETAMINOPHEN 10 MG/ML IV SOLN
1000.0000 mg | Freq: Once | INTRAVENOUS | Status: AC
  Administered 2022-08-19: 1000 mg via INTRAVENOUS

## 2022-08-19 MED ORDER — SODIUM CHLORIDE 0.9 % IV SOLN
2.0000 g | Freq: Four times a day (QID) | INTRAVENOUS | Status: DC
  Administered 2022-08-19: 2 g via INTRAVENOUS
  Filled 2022-08-19: qty 2000

## 2022-08-19 MED ORDER — GENTAMICIN SULFATE 40 MG/ML IJ SOLN
5.0000 mg/kg | INTRAVENOUS | Status: DC
  Administered 2022-08-19: 430 mg via INTRAVENOUS
  Filled 2022-08-19: qty 10.75

## 2022-08-19 MED ORDER — DIPHENHYDRAMINE HCL 50 MG/ML IJ SOLN: 12.5000 mg | INTRAMUSCULAR | Status: DC | PRN

## 2022-08-19 NOTE — Progress Notes (Signed)
Patient ID: Sara Mueller, female   DOB: 2001/01/18, 21 y.o.   MRN: [id number]  Got some rest last night after Nubain 10mg  IV; still breathing thru ctx and changing positions regularly; WB consent signed overnight  BPs 105/62, 97/57, other VSS IA shows FHR 140s with accels during ctx Ctx 2-3 mins per obs Cx 5/90/vtx -1 with BBOW  IUP@40 .2wks Early active labor  -Continue w expectant management with ability to get into the tub prn -Anticipate vag del  CNM 08/19/2022

## 2022-08-19 NOTE — Progress Notes (Signed)
Labor Progress Note Sara Mueller is a 21 y.o. G1P0 at [redacted]w[redacted]d presented for labor. Partner and mother at bedside.  S:  Breathing through ctx, using NO. Was in tub earlier but didn't like it.  O:  BP (!) 146/80   Pulse (!) 123   Temp 98.7 F (37.1 C) (Oral)   Resp 20   Ht 5\' 3"  (1.6 m)   LMP 11/10/2021   SpO2 99%   BMI 33.28 kg/m  IA: 140 bpm SVE: Dilation: 6.5 Effacement (%): 100 Station: -1 Presentation: Vertex Exam by:: CNM Bambrie   A/P: 21 y.o. G1P0 [redacted]w[redacted]d  1. Labor: active, slow progress 2. FWB: Cat I 3. Pain: water immersion/analgesia/anesthesia/NO prn  Consented for AROM of forebag. AROM'd for small amt light MSF. Anticipate labor progress and SVD.  [redacted]w[redacted]d, CNM 2:59 PM

## 2022-08-19 NOTE — Anesthesia Procedure Notes (Signed)
Epidural Patient location during procedure: OB Start time: 08/19/2022 5:07 PM End time: 08/19/2022 5:12 PM  Staffing Anesthesiologist: Linton Rump, MD Performed: anesthesiologist   Preanesthetic Checklist Completed: patient identified, IV checked, site marked, risks and benefits discussed, surgical consent, monitors and equipment checked, pre-op evaluation and timeout performed  Epidural Patient position: sitting Prep: DuraPrep and site prepped and draped Patient monitoring: continuous pulse ox and blood pressure Approach: midline Location: L3-L4 Injection technique: LOR saline  Needle:  Needle type: Tuohy  Needle gauge: 17 G Needle length: 9 cm and 9 Needle insertion depth: 6 cm Catheter type: closed end flexible Catheter size: 19 Gauge Catheter at skin depth: 10 cm Test dose: negative  Assessment Events: blood not aspirated, injection not painful, no injection resistance, no paresthesia and negative IV test  Additional Notes The patient has requested an epidural for labor pain management. Risks and benefits including, but not limited to, infection, bleeding, local anesthetic toxicity, headache, hypotension, back pain, block failure, etc. were discussed with the patient. The patient expressed understanding and consented to the procedure. I confirmed that the patient has no bleeding disorders and is not taking blood thinners. I confirmed the patient's last platelet count with the nurse. A time-out was performed immediately prior to the procedure. Please see nursing documentation for vital signs. Sterile technique was used throughout the whole procedure. Once LOR achieved, the epidural catheter threaded easily without resistance. Aspiration of the catheter was negative for blood and CSF. The epidural was dosed slowly and an infusion was started.  1 attempt(s)Reason for block:procedure for pain

## 2022-08-19 NOTE — Progress Notes (Signed)
Labor Progress Note Sara Mueller is a 21 y.o. G1P0 at [redacted]w[redacted]d presented for labor  S:  Feeling ctx, using nitrous currently. Planning a waterbirth.  O:  BP (!) 110/56   Pulse 96   Temp 98.4 F (36.9 C) (Axillary)   Resp 18   Ht 5\' 3"  (1.6 m)   LMP 11/10/2021   SpO2 99%   BMI 33.28 kg/m  IA: 130 bpm SVE: deferred  A/P: 21 y.o. G1P0 [redacted]w[redacted]d  1. Labor: early active 2. FWB: Cat I 3. Pain: water immersion/analgesia/anesthesia/NO prn  Expectant mngt. IA: Auscultate FHR during and one minute after a contraction every hour in early labor, every 30 minutes in active labor, every 15 minutes in transition and every 5 minutes in second stage. Anticipate SVD.  [redacted]w[redacted]d, CNM 9:25 AM

## 2022-08-19 NOTE — Lactation Note (Signed)
This note was copied from a baby's chart. Lactation Consultation Note  Patient Name: [name] [id number]'[id number] Date: 08/19/2022   Age:21 hours RN in L&D Asher Muir),  will call LC on Vocera when Birth Parent has finished her repair in L&D, for latch assistance.  Maternal Data    Feeding    LATCH Score                    Lactation Tools Discussed/Used    Interventions    Discharge    Consult Status      Sara Mueller 08/19/2022, 11:43 PM

## 2022-08-19 NOTE — Progress Notes (Signed)
Labor Progress Note Sara Mueller is a 21 y.o. G1P0 at [redacted]w[redacted]d presented for   S: No acute concerns.  O:  BP (!) 106/58   Pulse 96   Temp (!) 101.4 F (38.6 C)   Resp 19   Ht 5\' 3"  (1.6 m)   LMP 11/10/2021   SpO2 100%   BMI 33.28 kg/m  EFM: 160/min to mod variability/recurrent variables  CVE: Dilation: 10 Dilation Complete Date: 08/19/22 Dilation Complete Time: 2054 Effacement (%): 90 Station: 0 Presentation: Vertex Exam by:: Dr. [email or web address]   A&P: 21 y.o. G1P0 [redacted]w[redacted]d here for latent labor and DFM.  #Labor: Was anterior lip and feeling more pelvic discomfort. Able to push and labor down beyond anterior lip. Able to improve station from -1 to 0 on hands and knees. With prolonged decels to 70s attempted ride sided pushing, HR did not improve between pushes. Dr. [redacted]w[redacted]d came to bedside. Pit off, terb x1 given. FSE and IUPC placed, amnio infusion started. Of note, now has chorio will treat with tylenol, amp, and gent. Monitor for fetal HR improvement and resume pushing.  #Pain: Epidural #FWB: Cat II #GBS negative   Jaydn Moscato Autry-Lott, DO 10:20 PM

## 2022-08-19 NOTE — Progress Notes (Signed)
Pharmacy Antibiotic Note  Sara Mueller is a 21 y.o. female admitted on 08/18/2022 for IOL due to decreased fetal movement at [redacted]w[redacted]d.  Pharmacy has been consulted for Gentamicin dosing for chorioamnionitis/Triple I  Plan: Gentamicin 5mg /kg (430mg ) IV q24h Will continue to folow and assess need for further kinetic workup.   Height: 5\' 3"  (160 cm) IBW/kg (Calculated) : 52.4  Temp (24hrs), Avg:99.4 F (37.4 C), Min:98.4 F (36.9 C), Max:101.4 F (38.6 C)  Recent Labs  Lab 08/18/22 1610  WBC 23.4*    CrCl cannot be calculated (Patient's most recent lab result is older than the maximum 21 days allowed.).    No Known Allergies  Antimicrobials this admission: Ampicillin 2 gram IV q6h    Thank you for allowing pharmacy to be a part of this patient's care.  08/19/2022 10:18 PM

## 2022-08-19 NOTE — Progress Notes (Addendum)
Labor Progress Note Sara Mueller is a 21 y.o. G1P0 at [redacted]w[redacted]d presented for labor  S:  Breathing through ctx, sitting on toilet, recent LOF  O:  BP (!) 116/54   Pulse 99   Temp 99.4 F (37.4 C) (Axillary)   Resp 18   Ht 5\' 3"  (1.6 m)   LMP 11/10/2021   SpO2 99%   BMI 33.28 kg/m  IA: 135 bpm SVE: 6/100/0 per RN  A/P: 21 y.o. G1P0 [redacted]w[redacted]d  1. Labor: active, progressing well 2. FWB: Cat I 3. Pain: water immersion/analgesia/anesthesia/NO prn  SROM w/light MSF. Has been using NO with good relief, may use tub as desired. IA: Auscultate FHR during and one minute after a contraction every hour in early labor, every 30 minutes in active labor, every 15 minutes in transition and every 5 minutes in second stage. Anticipate labor progress and SVD.  [redacted]w[redacted]d, CNM 12:00 PM

## 2022-08-19 NOTE — Progress Notes (Signed)
Labor Progress Note SHEALEE YORDY is a 21 y.o. G1P0 at [redacted]w[redacted]d presented for labor  S:  Comfortable with epidural  O:  BP (!) 98/59   Pulse (!) 109   Temp 100 F (37.8 C) (Oral)   Resp 19   Ht 5\' 3"  (1.6 m)   LMP 11/10/2021   SpO2 99%   BMI 33.28 kg/m  EFM: baseline 160 bpm/ mod variability/ no accels/ variable, prolonged decels  Toco/IUPC: 1-3 SVE: 9/90/0 by [birth year]-16[birth year]1988, CNM   A/P: 21 y.o. G1P0 [redacted]w[redacted]d  1. Labor: active, progressing well 2. FWB: Cat II 3. Pain: epidural  Fetal tachycardia, IVF bolus, follow temp closely, abx if indicated. Anticipate labor progress and SVD.  [redacted]w[redacted]d, CNM 7:21 PM

## 2022-08-19 NOTE — Anesthesia Preprocedure Evaluation (Signed)
Anesthesia Evaluation  Patient identified by MRN, date of birth, ID band Patient awake    Reviewed: Allergy & Precautions, NPO status , Patient's Chart, lab work & pertinent test results  History of Anesthesia Complications Negative for: history of anesthetic complications  Airway Mallampati: III       Dental no notable dental hx.    Pulmonary asthma    Pulmonary exam normal breath sounds clear to auscultation       Cardiovascular negative cardio ROS  Rhythm:Regular Rate:Normal     Neuro/Psych  Headaches PSYCHIATRIC DISORDERS  Depression       GI/Hepatic negative GI ROS, Neg liver ROS,,,  Endo/Other  negative endocrine ROS    Renal/GU negative Renal ROS     Musculoskeletal   Abdominal   Peds  Hematology negative hematology ROS (+)   Anesthesia Other Findings   Reproductive/Obstetrics (+) Pregnancy                             Anesthesia Physical Anesthesia Plan  ASA: 2  Anesthesia Plan: Epidural   Post-op Pain Management:    Induction:   PONV Risk Score and Plan:   Airway Management Planned:   Additional Equipment:   Intra-op Plan:   Post-operative Plan:   Informed Consent: I have reviewed the patients History and Physical, chart, labs and discussed the procedure including the risks, benefits and alternatives for the proposed anesthesia with the patient or authorized representative who has indicated his/her understanding and acceptance.       Plan Discussed with:   Anesthesia Plan Comments:        Anesthesia Quick Evaluation

## 2022-08-20 ENCOUNTER — Encounter (HOSPITAL_COMMUNITY): Payer: Self-pay | Admitting: Obstetrics & Gynecology

## 2022-08-20 LAB — CBC
HCT: 24 % — ABNORMAL LOW (ref 36.0–46.0)
Hemoglobin: 7.7 g/dL — ABNORMAL LOW (ref 12.0–15.0)
MCH: 23.8 pg — ABNORMAL LOW (ref 26.0–34.0)
MCHC: 32.1 g/dL (ref 30.0–36.0)
MCV: 74.3 fL — ABNORMAL LOW (ref 80.0–100.0)
Platelets: 153 10*3/uL (ref 150–400)
RBC: 3.23 MIL/uL — ABNORMAL LOW (ref 3.87–5.11)
RDW: 15 % (ref 11.5–15.5)
WBC: 28.4 10*3/uL — ABNORMAL HIGH (ref 4.0–10.5)
nRBC: 0 % (ref 0.0–0.2)

## 2022-08-20 MED ORDER — DIBUCAINE (PERIANAL) 1 % EX OINT: 1.0000 | TOPICAL_OINTMENT | CUTANEOUS | Status: DC | PRN

## 2022-08-20 MED ORDER — SENNOSIDES-DOCUSATE SODIUM 8.6-50 MG PO TABS
2.0000 | ORAL_TABLET | Freq: Every day | ORAL | Status: DC
  Administered 2022-08-21: 2 via ORAL
  Filled 2022-08-20: qty 2

## 2022-08-20 MED ORDER — POLYETHYLENE GLYCOL 3350 17 G PO PACK
17.0000 g | PACK | Freq: Every day | ORAL | Status: DC
  Administered 2022-08-20 – 2022-08-21 (×2): 17 g via ORAL
  Filled 2022-08-20 (×2): qty 1

## 2022-08-20 MED ORDER — ZOLPIDEM TARTRATE 5 MG PO TABS: 5.0000 mg | ORAL_TABLET | Freq: Every evening | ORAL | Status: DC | PRN

## 2022-08-20 MED ORDER — OXYCODONE HCL 5 MG PO TABS: 5.0000 mg | ORAL_TABLET | Freq: Four times a day (QID) | ORAL | Status: DC | PRN

## 2022-08-20 MED ORDER — SIMETHICONE 80 MG PO CHEW: 80.0000 mg | CHEWABLE_TABLET | ORAL | Status: DC | PRN

## 2022-08-20 MED ORDER — DIPHENHYDRAMINE HCL 25 MG PO CAPS: 25.0000 mg | ORAL_CAPSULE | Freq: Four times a day (QID) | ORAL | Status: DC | PRN

## 2022-08-20 MED ORDER — SODIUM CHLORIDE 0.9 % IV SOLN
500.0000 mg | Freq: Once | INTRAVENOUS | Status: AC
  Administered 2022-08-20: 500 mg via INTRAVENOUS
  Filled 2022-08-20: qty 500

## 2022-08-20 MED ORDER — TETANUS-DIPHTH-ACELL PERTUSSIS 5-2.5-18.5 LF-MCG/0.5 IM SUSY: 0.5000 mL | PREFILLED_SYRINGE | Freq: Once | INTRAMUSCULAR | Status: DC

## 2022-08-20 MED ORDER — IBUPROFEN 600 MG PO TABS
600.0000 mg | ORAL_TABLET | Freq: Four times a day (QID) | ORAL | Status: DC
  Administered 2022-08-20 – 2022-08-21 (×6): 600 mg via ORAL
  Filled 2022-08-20 (×6): qty 1

## 2022-08-20 MED ORDER — ONDANSETRON HCL 4 MG/2ML IJ SOLN: 4.0000 mg | INTRAMUSCULAR | Status: DC | PRN

## 2022-08-20 MED ORDER — ONDANSETRON HCL 4 MG PO TABS: 4.0000 mg | ORAL_TABLET | ORAL | Status: DC | PRN

## 2022-08-20 MED ORDER — COCONUT OIL OIL: 1.0000 | TOPICAL_OIL | Status: DC | PRN

## 2022-08-20 MED ORDER — BENZOCAINE-MENTHOL 20-0.5 % EX AERO
1.0000 | INHALATION_SPRAY | CUTANEOUS | Status: DC | PRN
  Filled 2022-08-20: qty 56

## 2022-08-20 MED ORDER — PRENATAL MULTIVITAMIN CH
1.0000 | ORAL_TABLET | Freq: Every day | ORAL | Status: DC
  Administered 2022-08-20 – 2022-08-21 (×2): 1 via ORAL
  Filled 2022-08-20 (×2): qty 1

## 2022-08-20 MED ORDER — ACETAMINOPHEN 325 MG PO TABS
650.0000 mg | ORAL_TABLET | ORAL | Status: DC | PRN
  Administered 2022-08-20: 650 mg via ORAL
  Filled 2022-08-20: qty 2

## 2022-08-20 MED ORDER — WITCH HAZEL-GLYCERIN EX PADS
1.0000 | MEDICATED_PAD | CUTANEOUS | Status: DC | PRN
  Administered 2022-08-21: 1 via TOPICAL

## 2022-08-20 NOTE — Discharge Summary (Signed)
Postpartum Discharge Summary  Date of Service updated***     Patient Name: Sara Mueller DOB: May 14, 2001 MRN: [id number]  Date of admission: 08/18/2022 Delivery date:08/19/2022  Delivering provider: Gerlene Fee  Date of discharge: 08/20/2022  Admitting diagnosis: Pregnancy [Z34.90] Intrauterine pregnancy: [redacted]w[redacted]d    Secondary diagnosis:  Active Problems:   MDD (major depressive disorder)   Abnormal fetal ultrasound  Additional problems: ***    Discharge diagnosis: {DX.:23714}                                              Post partum procedures:{Postpartum procedures:23558} Augmentation: AROM Complications: Intrauterine Inflammation or infection (Chorioamniotis)  Hospital course: Onset of Labor With Vacuum Assisted Vaginal Delivery      21y.o. yo G1P0 at 438w3das admitted in Latent Labor on 08/18/2022. Labor course was complicated by OP presentation, thick meconium, chorio (treated intrapartum) and fetal intolerance of labor 2/2 prolonged decels. Review delivery summary in a separate note. Membrane Rupture Time/Date: 11:45 AM ,08/19/2022   Delivery Method:Vaginal, Vacuum (Extractor)  Episiotomy: None  Lacerations:  3rd degree , B Patient had a postpartum course complicated by ***.  She is ambulating, tolerating a regular diet, passing flatus, and urinating well. Patient is discharged home in stable condition on 08/20/22.  Newborn Data: Birth date:08/19/2022  Birth time:11:19 PM  Gender:Female  Living status:Living  Apgars:1 ,7  Weight:3240 g   Magnesium Sulfate received: No BMZ received: No Rhophylac:No MMR:No T-DaP:Given prenatally Flu: {F{XAJ:28786}ransfusion:{Transfusion received:30440034}  Physical exam  Vitals:   08/19/22 2157 08/19/22 2201 08/19/22 2338 08/20/22 0004  BP:  104/74  (!) 113/48  Pulse:  (!) 136  (!) 116  Resp:    15  Temp: (!) 101.4 F (38.6 C)  99.5 F (37.5 C)   TempSrc: Axillary  Oral   SpO2:      Height:       General: {Exam;  general:21111117} Lochia: {Desc; appropriate/inappropriate:30686::"appropriate"} Uterine Fundus: {Desc; firm/soft:30687} Incision: {Exam; incision:21111123} DVT Evaluation: {Exam; dvt:2111122} Labs: Lab Results  Component Value Date   WBC 23.4 (H) 08/18/2022   HGB 10.3 (L) 08/18/2022   HCT 31.5 (L) 08/18/2022   MCV 74.6 (L) 08/18/2022   PLT 198 08/18/2022      Latest Ref Rng & Units 02/07/2022   12:16 PM  CMP  Glucose 70 - 99 mg/dL 80   BUN 6 - 20 mg/dL 8   Creatinine 0.44 - 1.00 mg/dL 0.63   Sodium 135 - 145 mmol/L 135   Potassium 3.5 - 5.1 mmol/L 4.1   Chloride 98 - 111 mmol/L 108   CO2 22 - 32 mmol/L 21   Calcium 8.9 - 10.3 mg/dL 9.2   Total Protein 6.5 - 8.1 g/dL 7.0   Total Bilirubin 0.3 - 1.2 mg/dL 0.6   Alkaline Phos 38 - 126 U/L 47   AST 15 - 41 U/L 16   ALT 0 - 44 U/L 7    Edinburgh Score:     No data to display           After visit meds:  Allergies as of 08/20/2022   No Known Allergies   Med Rec [facility] completed [facility]        Discharge home in stable condition Infant Feeding: {Baby feeding:23562} Infant Disposition:{CHL IP OB HOME WITH MOVEHMCN:47096}ischarge instruction:  per After Visit Summary and Postpartum booklet. Activity: Advance as tolerated. Pelvic rest for 6 weeks.  Diet: {OB QKMM:38177116} Future Appointments:No future appointments. Follow up Visit:  Message sent to [facility] by Autry-Lott on 08/20/2022  Please schedule this patient for a In person postpartum visit in 4 weeks with the following provider: MD and APP. Additional Postpartum F/U: 3rd degree, B   Low risk pregnancy complicated by:  fetal pyelectasis Delivery mode:  Vaginal, Vacuum Neurosurgeon)  Anticipated Birth Control:   NFP   08/20/2022 Gerlene Fee, DO

## 2022-08-20 NOTE — Lactation Note (Signed)
This note was copied from a baby's chart. Lactation Consultation Note  Patient Name: Sara Mueller [id number] Date: 08/20/2022 Reason for consult: L&D Initial assessment;Term Age:21 hours LC assisted Birth Parent with doing reverse pressure softening prior to latching infant at the breast, see Birth Parent nipple type below, Birth Parent latched infant on her right breast using the football hold position. Infant latched with depth and sustained latch during the feeding, infant was still breastfeeding after 11 minutes when LC left the room. Birth Parent knows to ask RN/LC for further latch assistance on MBU if needed. Birth Parent will continue to BF infant according to hunger cues, on  demand, 8 to 12+ times within 24 hours, STS. Birth Parent may benefit from hand pump to pre-pump breast prior to latching infant to help evert nipples shaft out more. Maternal Data    Feeding Mother's Current Feeding Choice: Breast Milk  LATCH Score Latch: Grasps breast easily, tongue down, lips flanged, rhythmical sucking.  Audible Swallowing: Spontaneous and intermittent  Type of Nipple: Flat (LC did reverse pressure softening to help evert nipple out prior to latching at breast. Birth Parent may need hand pump to pre-pump breast prior to latching infant.)  Comfort (Breast/Nipple): Soft / non-tender  Hold (Positioning): Assistance needed to correctly position infant at breast and maintain latch.  LATCH Score: 8   Lactation Tools Discussed/Used    Interventions Interventions: Assisted with latch;Skin to skin;Reverse pressure;Position options;Support pillows;Adjust position;Education  Discharge    Consult Status Consult Status: Follow-up from L&D    Frederico Hamman 08/20/2022, 1:16 AM

## 2022-08-20 NOTE — Anesthesia Postprocedure Evaluation (Signed)
Anesthesia Post Note  Patient: Sara Mueller  Procedure(s) Performed: AN AD HOC LABOR EPIDURAL     Patient location during evaluation: Mother Baby Anesthesia Type: Epidural Level of consciousness: awake, oriented and awake and alert Pain management: pain level controlled Vital Signs Assessment: post-procedure vital signs reviewed and stable Respiratory status: spontaneous breathing, respiratory function stable and nonlabored ventilation Cardiovascular status: stable Postop Assessment: adequate PO intake, able to ambulate, no headache, patient able to bend at knees and no apparent nausea or vomiting Anesthetic complications: no   No notable events documented.  Last Vitals:  Vitals:   08/20/22 0230 08/20/22 0400  BP: 107/63 115/77  Pulse:  (!) 104  Resp: 16 16  Temp: 37.5 C 37.3 C  SpO2: 95% 100%    Last Pain:  Vitals:   08/20/22 0400  TempSrc: Oral  PainSc: 6    Pain Goal:                   [name]

## 2022-08-20 NOTE — Lactation Note (Addendum)
This note was copied from a baby's chart. Lactation Consultation Note  Patient Name: Sara Mueller [id number] Date: 08/20/2022 Reason for consult: Follow-up assessment, RN Request Age:23 hours  P1, Baby showing feeding cues. Reviewed hand expression and demonstrated how to prepump with manual pump if mother has difficulty latching. Baby latched in football hold.  Demonstrated how to compress breast to keep baby active. Feed on demand with cues.  Goal 8-12+ times per day after first 24 hrs.   Mom made aware of O/P services, breastfeeding support groupand our phone # for post-discharge questions.    Maternal Data Has patient been taught Hand Expression?: Yes Does the patient have breastfeeding experience prior to this delivery?: No  Feeding Mother's Current Feeding Choice: Breast Milk  LATCH Score Latch: Grasps breast easily, tongue down, lips flanged, rhythmical sucking.  Audible Swallowing: A few with stimulation  Type of Nipple: Everted at rest and after stimulation  Comfort (Breast/Nipple): Soft / non-tender  Hold (Positioning): Assistance needed to correctly position infant at breast and maintain latch.  LATCH Score: 8   Lactation Tools Discussed/Used  Manual pump  Interventions Interventions: Breast feeding basics reviewed;Assisted with latch;Skin to skin;Hand express;Pre-pump if needed;Hand pump;Education;LC Services brochure  Consult Status Consult Status: Follow-up Date: 08/21/22 Follow-up type: In-patient    Dahlia Byes [facility] 08/20/2022, 11:53 AM

## 2022-08-20 NOTE — Lactation Note (Signed)
This note was copied from a baby's chart. Lactation Consultation Note  Patient Name: Sara Mueller [id number] Date: 08/20/2022 Reason for consult: Mother's request;Follow-up assessment Age:23 hours Birth Parent latched infant on her left breast using the football hold position, infant latched  with depth and LC let Birth Parent latch infant at breast after few latches , infant BF for 11 minutes. Birth Parent will continue to BF infant according to hunger cues, on demand, 8 to 12 times within 24 hours, STS. Birth Parent knows to call RN/LC for further latch assistance if needed. LC discussed maternal rest, diet and hydration. Maternal Data    Feeding Mother's Current Feeding Choice: Breast Milk  LATCH Score Latch: Grasps breast easily, tongue down, lips flanged, rhythmical sucking.  Audible Swallowing: A few with stimulation  Type of Nipple: Everted at rest and after stimulation (used hand pump to pre-pump prior to latching infant at the breast.)  Comfort (Breast/Nipple): Soft / non-tender  Hold (Positioning): Assistance needed to correctly position infant at breast and maintain latch.  LATCH Score: 8   Lactation Tools Discussed/Used    Interventions Interventions: Adjust position;Support pillows;Position options;Breast compression  Discharge    Consult Status Consult Status: Follow-up Date: 08/21/22 Follow-up type: In-patient    Frederico Hamman 08/20/2022, 11:16 PM

## 2022-08-20 NOTE — Progress Notes (Signed)
POSTPARTUM PROGRESS NOTE  Post Partum Day 1  Subjective:  LINET BRASH is a 21 y.o. G1P1001 s/p VAVD at [redacted]w[redacted]d.  She reports she is doing well. No acute events overnight. She denies any problems with ambulating, voiding or po intake. Denies nausea or vomiting.  Pain is moderately controlled.  Lochia is small.  Objective: Blood pressure 115/77, pulse (!) 104, temperature 99.2 F (37.3 C), temperature source Oral, resp. rate 16, height 5\' 3"  (1.6 m), last menstrual period 11/10/2021, SpO2 100 %, unknown if currently breastfeeding.  Physical Exam:  General: alert, cooperative and no distress Chest: no respiratory distress Heart:regular rate, distal pulses intact Abdomen: soft, nontender,  Uterine Fundus: firm, appropriately tender DVT Evaluation: No calf swelling or tenderness Extremities: No edema Skin: warm, dry  Recent Labs    08/18/22 1610 08/20/22 0530  HGB 10.3* 7.7*  HCT 31.5* 24.0*    Assessment/Plan: LAKEASHA PETION is a 21 y.o. G1P1001 s/p VAVD at [redacted]w[redacted]d   PPD# 1- Doing well Routine postpartum care Hgb down 7.7 from 10.3. Patient denies dizziness or light headedness with standing. Tachycardia noted. IV venofer ordered.  Contraception: Declines Feeding: Breastfeeding  Dispo: Plan for discharge Tomorrow.   LOS: 2 days   [redacted]w[redacted]d, SNM 08/20/2022, 7:51 AM

## 2022-08-21 ENCOUNTER — Ambulatory Visit: Payer: Self-pay

## 2022-08-21 MED ORDER — WITCH HAZEL-GLYCERIN EX PADS: 1.0000 | MEDICATED_PAD | CUTANEOUS | 12 refills | Status: AC | PRN

## 2022-08-21 MED ORDER — IBUPROFEN 600 MG PO TABS: 600.0000 mg | ORAL_TABLET | Freq: Four times a day (QID) | ORAL | 0 refills | Status: DC

## 2022-08-21 MED ORDER — DOCUSATE SODIUM 100 MG PO CAPS: 100.0000 mg | ORAL_CAPSULE | Freq: Two times a day (BID) | ORAL | 2 refills | Status: AC | PRN

## 2022-08-21 MED ORDER — OXYCODONE HCL 5 MG PO TABS: 5.0000 mg | ORAL_TABLET | Freq: Four times a day (QID) | ORAL | 0 refills | Status: AC | PRN

## 2022-08-21 MED ORDER — POLYETHYLENE GLYCOL 3350 17 G PO PACK: 17.0000 g | PACK | Freq: Every day | ORAL | 0 refills | Status: AC

## 2022-08-21 NOTE — Lactation Note (Signed)
This note was copied from a baby's chart. Lactation Consultation Note  Patient Name: Sara Mueller [id number] Date: 08/21/2022 Reason for consult: Follow-up assessment;Mother's request Age:23 hours  Mom, G1P1001 requested LC due to concern about compression stripe on left nipple.   MOB previously pumped 10 ml of breast milk while feeding "Sara Mueller". Mother states she fed on one breast and pumped on the other then switched (pumped and latched on opposite breast). Feeding occurred for 33 minutes. Expressed milk was given to Baby by a 44fr with finger.   Latch was observed. Baby latched onto left breast in football hold and cross cradle. Mother does not have a preference on which feeding position. Baby would suck but no audible swallows were observed. Mother was encouraged to supplement with Donor milk or formula but declined.   Educated parents on supplementation guidelines.  Provided resource sheet on lactation support after being discharged.   Feeding plan is to latch baby on demand. If "Sara Mueller" does not latch, pump and supplement with expressed milk.   Congratulated parents on giving birth.    Maternal Data Does the patient have breastfeeding experience prior to this delivery?: No  Feeding Mother's Current Feeding Choice: Breast Milk  LATCH Score Latch: Repeated attempts needed to sustain latch, nipple held in mouth throughout feeding, stimulation needed to elicit sucking reflex.  Audible Swallowing: None  Type of Nipple: Everted at rest and after stimulation  Comfort (Breast/Nipple): Filling, red/small blisters or bruises, mild/mod discomfort  Hold (Positioning): Assistance needed to correctly position infant at breast and maintain latch.  LATCH Score: 5   Lactation Tools Discussed/Used Tools: 37F feeding tube / Syringe (previously pumped 10 ml, expressed milk supplemented)  Interventions Interventions: Skin to skin;Breast massage;Expressed milk;Support pillows;Breast  compression;Education  Discharge WIC Program: Yes  Consult Status Consult Status: Follow-up Date: 08/22/22 Follow-up type: In-patient    Sara Mueller 08/21/2022, 11:10 PM

## 2022-08-21 NOTE — Lactation Note (Addendum)
This note was copied from a baby's chart. Lactation Consultation Note  Patient Name: Sara Mueller HYWVP'X Date: 08/21/2022 Reason for consult: Follow-up assessment;Difficult latch;Term;Infant weight loss;Breastfeeding assistance;1st time breastfeeding;Primapara (8.21% WL) Age:23 hours  LC entered the room and the infant was breastfeeding on the left breast.  Per the birth parent, the infant has been cluster feeding since last night.  She stated that she was very sore.  LC gave the birth parent coconut oil for the soreness.  LC set the birth parent up with a DEBP per her request for supplementation due to weight loss.  The birth parent used a size #24 flange and stated that she was comfortable.  The birth parent is aware to pump after feedings and feed the infant her EBM.  LC reviewed pumping frequency and milk storage.  Current Feeding Plan:  Breastfeed 8+ times per day according to feeding cues.  Pump after feedings and feed EBM to the infant.  Call RN/LC to assist with breastfeeding.   Maternal Data    Feeding Mother's Current Feeding Choice: Breast Milk  LATCH Score Latch: Grasps breast easily, tongue down, lips flanged, rhythmical sucking.  Audible Swallowing: Spontaneous and intermittent  Type of Nipple: Flat  Comfort (Breast/Nipple): Filling, red/small blisters or bruises, mild/mod discomfort  Hold (Positioning): No assistance needed to correctly position infant at breast.  LATCH Score: 8   Lactation Tools Discussed/Used Tools: Coconut oil Breast pump type: Double-Electric Breast Pump Pump Education: Setup, frequency, and cleaning;Milk Storage Reason for Pumping: Mother's Request Pumping frequency: after each feeding  Interventions Interventions: Breast feeding basics reviewed;DEBP;Education;Coconut oil  Discharge    Consult Status Consult Status: Follow-up Date: 08/22/22 Follow-up type: In-patient    Orvil Feil Akelia Husted 08/21/2022, 1:01 PM

## 2022-08-21 NOTE — Lactation Note (Signed)
This note was copied from a baby's chart. Lactation Consultation Note  Patient Name: Sara Mueller [id number] Date: 08/21/2022 Reason for consult: Follow-up assessment;Mother's request Age:23 hours  When LC and student entered, baby was crying, rooting with hands to mouth.  Dad holding baby.  Parent states she is very tired and would like to rest.  She has milk in refrigerator that she has pumped and asked about feeding it for the next feeding.  She has sore nipple on the right; bruised with compression stripe.    LC suggested feeding baby now with EBM due to crying and rooting.    LC used 5 fr and finger fed 10 ml of EBM.  Baby relaxed some after feeding but began rooting when parent held him.  LC observed baby latch but rhythmic sucking with swallows was not noted. Parent very soothing to baby and calm.   Baby seemed to open wide to latch, but no jaw movements noted.   Baby alert.    LC recommended supplementation of donor milk or formula and for parent to rest. Parent declined supplementation.  RN notified.  LC encouraged pumping in order to collect more EBM to feed back to baby, hand expression, STS.   LC praised mom's efforts and family for pumping and providing milk for baby and the STS demonstrated by mom and dad. Guidelines provided for supplementation.   When LC left baby was asleep on parent's chest.      Maternal Data Does the patient have breastfeeding experience prior to this delivery?: No  Feeding Mother's Current Feeding Choice: Breast Milk  LATCH Score Latch: Repeated attempts needed to sustain latch, nipple held in mouth throughout feeding, stimulation needed to elicit sucking reflex.  Audible Swallowing: None  Type of Nipple: Everted at rest and after stimulation  Comfort (Breast/Nipple): Filling, red/small blisters or bruises, mild/mod discomfort  Hold (Positioning): Assistance needed to correctly position infant at breast and maintain latch.  LATCH  Score: 5   Lactation Tools Discussed/Used Tools: 18F feeding tube / Syringe (previously pumped 10 ml, expressed milk supplemented)  Interventions Interventions: Skin to skin;Breast massage;Expressed milk;Support pillows;Breast compression;Education  Discharge WIC Program: Yes  Consult Status Consult Status: Follow-up Date: 08/22/22 Follow-up type: In-patient    Maryruth Hancock [facility] 08/21/2022, 11:52 PM

## 2022-08-21 NOTE — Progress Notes (Addendum)
Began to discuss the fact that patient has a discharge order; however, infant needs to stay a patient overnight due to monitoring infant after maternal temp and renal ultrasound scheduled for tomorrow. Patient states she would feel more comfortable and would like to stay a patient until she has a bowel movement due to her 3rd degree laceration. Patient states she was told this morning that she could stay and she is upset that a discharge order was placed. Informed patient that this RN will call MD on call to notify of patient's concern. Called Linzie Collin, the resident on call, who states she will come and speak with the patient. Sara Mueller  Update: Linzie Collin called this RN stating that she has spoken with the patient and the patient is aware of discharge orders and is ok with rooming in with the infant and obtaining her own medications for the night stay. Earl Gala, Linda Hedges [place]

## 2022-08-22 ENCOUNTER — Ambulatory Visit: Payer: Self-pay

## 2022-08-22 NOTE — Lactation Note (Addendum)
This note was copied from a baby's chart. Lactation Consultation Note  Patient Name: Boy Salinda Snedeker [id number] Date: 08/22/2022 Reason for consult: Follow-up assessment;Nipple pain/trauma (2nd LC visit . mom pumping with the DEBP #24 F and per mom comfortable. Noted the right nipple to have a small cracking.) Age:23 hours Per mom has coconut oil and mentioned the skin is dry after using it.  LC recommended to reapply a dab of coconut oil.  Maternal Data    Feeding Mother's Current Feeding Choice: Breast Milk and Formula Nipple Type: Slow - flow  LATCH Score Latch:  (per mom preferred the baby be fed with bottle.) Lactation Tools Discussed/Used Tools: Pump Flange Size: 24 Breast pump type: Double-Electric Breast Pump Pump Education: Other (comment) (mom aware of set up)  Interventions Interventions: Breast feeding basics reviewed;DEBP;Education;LC Services brochure  Discharge Discharge Education: Engorgement and breast care;Warning signs for feeding baby;Outpatient recommendation;Outpatient Epic message sent;Other (comment) (mom receptive to have LC request and LC O/P appt . mom aware she will receive a call .) Pump: DEBP;Personal WIC Program: Yes  Consult Status Consult Status: Follow-up Date: 08/22/22 Follow-up type: In-patient    Matilde Sprang Jeane Cashatt 08/22/2022, 10:12 AM

## 2022-08-22 NOTE — Lactation Note (Signed)
This note was copied from a baby's chart. Lactation Consultation Note  Patient Name: Sara Mueller Date: 08/22/2022 Reason for consult: Follow-up assessment;Primapara;1st time breastfeeding;Term;Infant weight loss (10 % weight loss, baby awake and hungry. LC offered to assist to latch and per mom preferred for baby to have a bottle. Per mom expressed she thinks she will just pump and bottle feed. LC explored options.) Age:23 hours When Dad started feeding the bottle. LC noted a rubbing noise and LC instructed dad to ease the nipple into baby's mouth so the baby's lips were more flanged and the rubbing noise disappeared.  LC reviewed breast feeding discharge teaching and options if mom decides to re-latch when her milk increases with volume and how to transition baby back to the breast.  2nd option - if she decides to just pump and bottle feed - to pump around feeding times with baby both breast for 15 -20 mins ( 8-10 times a day ).  Per mom has a DEBP at home.  LC reviewed with parents and family in the room about the volume needing to increase to at least 30 ml per feeding and increase up if just receiving a bottle.  If the baby re-latches until the weight loss improves , feed 1st breast 15 -20 mins , supplement at least 30 ml and post pump both breast  save milk for the next feeding.  See below for D/C. F/U .  Maternal Data    Feeding Nipple Type: Slow - flow  LATCH Score Latch:  (per mom preferred the baby be fed with bottle.) Lactation Tools Discussed/Used  DEBP   Interventions Interventions: Breast feeding basics reviewed;DEBP;Education;LC Services brochure  Discharge Discharge Education: Engorgement and breast care;Warning signs for feeding baby;Outpatient recommendation;Outpatient Epic message sent;Other (comment) (mom receptive to have LC request and LC O/P appt . mom aware she will receive a call .) Pump: DEBP;Personal WIC Program: Yes  Consult Status Consult  Status: Complete Date: 08/22/22    Kathrin Greathouse 08/22/2022, 9:52 AM

## 2022-08-23 LAB — SURGICAL PATHOLOGY

## 2022-08-24 ENCOUNTER — Inpatient Hospital Stay (HOSPITAL_COMMUNITY): Payer: Managed Care, Other (non HMO)

## 2022-08-24 NOTE — Therapy (Unsigned)
OUTPATIENT PHYSICAL THERAPY FEMALE PELVIC EVALUATION   Patient Name: Sara Mueller MRN: [id number] DOB:[birth year], 21 y.o., female Today's Date: 08/26/2022   PT End of Session - 08/26/22 1507     Visit Number 1    Authorization Type [employer]    PT Start Time 1500    PT Stop Time 1545    PT Time Calculation (min) 45 min    Activity Tolerance Patient tolerated treatment well    Behavior During Therapy WFL for tasks assessed/performed             Past Medical History:  Diagnosis Date   Constipation    COVID-19    Headache    Intermittent asthma    QVAR rx'd in the past for more persistent symptoms,?compliant?.   Ovarian cyst    Vitamin D deficiency    Past Surgical History:  Procedure Laterality Date   NO PAST SURGERIES     Patient Active Problem List   Diagnosis Date Noted   Vacuum extractor delivery, delivered 08/21/2022   Third degree laceration of perineum, type 3b 08/21/2022   Pregnancy 08/18/2022   Abnormal fetal ultrasound 08/18/2022   Encounter for supervision of normal first pregnancy in third trimester 07/08/2022   Echogenic intracardiac focus of fetus on prenatal ultrasound 07/08/2022   MDD (major depressive disorder) 01/26/2021   Allergic rhinitis 01/22/2021   Asthma, mild persistent 04/05/2011    PCP: Katherina Mires, MD  REFERRING PROVIDER: Seabron Spates, CNM   REFERRING DIAG:  O75.9 (ICD-10-CM) - Vacuum extractor delivery, delivered  O70.22 (ICD-10-CM) - Third degree laceration of perineum, type 3b    THERAPY DIAG:  Muscle weakness (generalized)  Pelvic pain  Rationale for Evaluation and Treatment: Rehabilitation  ONSET DATE: 08/19/2022  SUBJECTIVE:                                                                                                                                                                                           SUBJECTIVE STATEMENT: Third degree tear B after vacuum assisted on 08/19/2022.    PAIN:   Are you having pain? Yes NPRS scale: 6/10 Pain location:  pelvic area  Pain type: intense soreness Pain description: intermittent   Aggravating factors: randomly Relieving factors: Ibuprofen  PRECAUTIONS: Other: had baby on 08/19/2022  WEIGHT BEARING RESTRICTIONS: No  FALLS:  Has patient fallen in last 6 months? No  LIVING ENVIRONMENT: Lives with: lives with their family  OCCUPATION: not working  PLOF: Independent  PATIENT GOALS: reduce soreness  PERTINENT HISTORY:  Vaginal birth 08/19/2022   BOWEL MOVEMENT: Pain with bowel movement: No  URINATION: Pain with urination:  No INTERCOURSE: Not having intercourse at this time due to just having a baby   PREGNANCY: Vaginal deliveries 1 Tearing Yes: third degree B and still has the stitiches Currently pregnant No    OBJECTIVE:   DIAGNOSTIC FINDINGS:  none   COGNITION: Overall cognitive status: Within functional limits for tasks assessed     SENSATION: Light touch: Appears intact Proprioception: Appears intact    POSTURE: No Significant postural limitations  PELVIC ALIGNMENT: ASIS are equal  LUMBARAROM/PROM: Lumbar ROM is full    LOWER EXTREMITY ZOX:[id number] hip ROM is full    LOWER EXTREMITY MMT:  MMT Right eval Left eval  Hip flexion 4/5 4/5  Hip extension 4/5 4/5  Hip abduction 4/5 4/5  Hip adduction 4/5 4/5  Hip internal rotation 4/5 4/5  Hip external rotation 4/5 4/5   PALPATION:   General  Decreased expansion of rib cage for 360 breathing                External Perineal Exam n/a                             Internal Pelvic Floor n/a  Patient confirms identification and approves PT to assess internal pelvic floor and treatment No, too soon after pregnancy  PELVIC MMT:   MMT eval  Vaginal   Internal Anal Sphincter   External Anal Sphincter   Puborectalis   Diastasis Recti Below umbilicus 1/2 finger and above  (Blank rows = not tested)           TODAY'S TREATMENT:                                                                                                                               DATE: 08/26/2022  EVAL See below Educated patient on transitional movements with engagement of her abdominals and to use her legs more than her back, educated patient on how to lift her infant without strain on her back and pelvic floor.    PATIENT EDUCATION:  Education details: Access Code: [email or web address] Person educated: Patient Education method: Explanation, Demonstration, Tactile cues, Verbal cues, and Handouts Education comprehension: verbalized understanding, returned demonstration, verbal cues required, tactile cues required, and needs further education  HOME EXERCISE PROGRAM: 08/26/2022 Access Code: [facility] URL: [email or web address] Date: 08/26/2022 Prepared by: Eulis Foster  Program Notes gently massage the lower abdomen for 3 minutes 3 times per day is lay down with pillow under hips for 10 minutes to reduce the swelling  Exercises - Supine Diaphragmatic Breathing  - 1 x daily - 7 x weekly - 3 sets - 10 reps - Seated Diaphragmatic Breathing  - 1 x daily - 7 x weekly - 3 sets - 10 reps - Hooklying Transversus Abdominis Palpation  - 1 x daily - 7 x weekly - 3 sets - 10 reps - Bent Knee Fallouts  - 1 x daily - 7 x  weekly - 1 sets - 5 reps  ASSESSMENT:  CLINICAL IMPRESSION: Patient is a 21 y.o. female who was seen today for physical therapy evaluation and treatment for Third degree laceration of the perineum. Patient had her infant vaginally on 08/19/2022. She reports pelvic pain at level  6/10. Her pelvic floor not assessed due to her not being cleared by the doctor. Patient has difficulty with engagement of the lower abdomen. She has decreased 360 degree movement of the rib cage. Bilateral hip strength is 4/5. Patient will benefit from skilled therapy to assist her with learning how to strengthen muscles for post pregnancy and work on her perineal  scar after the tear has healed.   OBJECTIVE IMPAIRMENTS: decreased activity tolerance, decreased endurance, decreased strength, increased fascial restrictions, and pain.   ACTIVITY LIMITATIONS: carrying, lifting, bending, sitting, standing, squatting, transfers, and caring for others  PARTICIPATION LIMITATIONS: meal prep, cleaning, laundry, shopping, and community activity  PERSONAL FACTORS: Fitness and 1 comorbidity: vaginal birth on 08/19/2022  are also affecting patient's functional outcome.   REHAB POTENTIAL: Excellent  CLINICAL DECISION MAKING: Stable/uncomplicated  EVALUATION COMPLEXITY: Low   GOALS: Goals reviewed with patient? Yes  SHORT TERM GOALS: Target date: 09/23/2022  Patient independent with engagement of the abdominals and diaphragmatic breathing.  Baseline:Not educated yet Goal status: INITIAL  2.  Patient understands how to perform daily tasks including taking care of her infant, lifting, housework and getting out of bed with abdominal engagement and using her legs to reduce strain on pelvic floor.  Baseline: Not educated yet Goal status: INITIAL   LONG TERM GOALS: Target date: 11/18/2022   Patient independent with HEP for core and pelvic floor to reduce strain on back and pelvic floor.  Baseline:  Goal status: INITIAL  2.  Patient educated on perineal manual work to keep the mobility and health of the perineal area to reduce the chance of urinary leakage.  Baseline:  Goal status: INITIAL  3.  Patient able to perform her daily tasks with taking care if the infant with pain level decreased to 0-1/10.  Baseline: pain level 6/10 Goal status: INITIAL  4.  Patient is able to contract her pelvic floor circularly to reduce the chance of urinary leakage due to improved mobility of the perineal body.  Baseline: Not assessed at this time due to 1.5 weeks post delivery.  Goal status: INITIAL   PLAN:  PT FREQUENCY: 1x/week  PT DURATION: 12 weeks  PLANNED  INTERVENTIONS: Therapeutic exercises, Therapeutic activity, Neuromuscular re-education, Balance training, Self Care, Dry Needling, Electrical stimulation, Cryotherapy, Moist heat, Biofeedback, and Manual therapy  PLAN FOR NEXT SESSION: abdominal exercises with arm and leg movement, go over body mechanics, vaginal moisturizer since nursing, back stretches, opening up the back rib cage, open book, cat camle in sitting   Earlie Counts, PT 08/26/22 4:08 PM

## 2022-08-26 ENCOUNTER — Encounter: Payer: Self-pay | Admitting: Physical Therapy

## 2022-08-26 ENCOUNTER — Other Ambulatory Visit: Payer: Self-pay

## 2022-08-26 ENCOUNTER — Encounter: Payer: Managed Care, Other (non HMO) | Attending: Advanced Practice Midwife | Admitting: Physical Therapy

## 2022-08-26 DIAGNOSIS — R102 Pelvic and perineal pain: Secondary | ICD-10-CM | POA: Diagnosis present

## 2022-08-26 DIAGNOSIS — M6281 Muscle weakness (generalized): Secondary | ICD-10-CM | POA: Diagnosis not present

## 2022-08-28 ENCOUNTER — Telehealth (HOSPITAL_COMMUNITY): Payer: Self-pay

## 2022-08-28 NOTE — Telephone Encounter (Signed)
Patient reports feeling good. Patient declines questions/concerns about her health and healing.  Patient reports that baby is doing well. Eating, peeing/pooping, and gaining weight well. Patient states she is waiting to hear about a urology recommendation. RN told patient to reach out to her pediatrician about urology recommendation. Baby sleeps in a bassinet. RN reviewed ABC's of safe sleep with patient. Patient declines any questions or concerns about baby.  EPDS- Phone line disconnected after question # 3. Attempted to call patient back, unable to reach patient. No voicemail option.   Marcelino Duster [facility] 08/28/22

## 2022-08-28 NOTE — Progress Notes (Signed)
Phone line disconnected after question #3. Attempted to call patient back but unable to reach her. No voicemail option.

## 2022-08-31 ENCOUNTER — Encounter: Payer: Managed Care, Other (non HMO) | Admitting: Physical Therapy

## 2022-09-07 ENCOUNTER — Encounter: Payer: Self-pay | Admitting: Physical Therapy

## 2022-09-07 ENCOUNTER — Ambulatory Visit: Payer: Managed Care, Other (non HMO) | Admitting: Physical Therapy

## 2022-09-07 DIAGNOSIS — R102 Pelvic and perineal pain: Secondary | ICD-10-CM

## 2022-09-07 DIAGNOSIS — M6281 Muscle weakness (generalized): Secondary | ICD-10-CM

## 2022-09-07 NOTE — Therapy (Addendum)
OUTPATIENT PHYSICAL THERAPY TREATMENT NOTE   Patient Name: Sara Mueller MRN: [id number] DOB:November 21, 2000, 21 y.o., female Today's Date: 09/07/2022  PCP: Katherina Mires, MD  REFERRING PROVIDER: Seabron Spates, CNM    END OF SESSION:   PT End of Session - 09/07/22 1513     Visit Number 2    Date for PT Re-Evaluation 11/18/22    Authorization Type [employer]    PT Start Time 1510    PT Stop Time 1550    PT Time Calculation (min) 40 min    Activity Tolerance Patient tolerated treatment well    Behavior During Therapy WFL for tasks assessed/performed             Past Medical History:  Diagnosis Date   Constipation    COVID-19    Headache    Intermittent asthma    QVAR rx'd in the past for more persistent symptoms,?compliant?.   Ovarian cyst    Vitamin D deficiency    Past Surgical History:  Procedure Laterality Date   NO PAST SURGERIES     Patient Active Problem List   Diagnosis Date Noted   Vacuum extractor delivery, delivered 08/21/2022   Third degree laceration of perineum, type 3b 08/21/2022   Pregnancy 08/18/2022   Abnormal fetal ultrasound 08/18/2022   Encounter for supervision of normal first pregnancy in third trimester 07/08/2022   Echogenic intracardiac focus of fetus on prenatal ultrasound 07/08/2022   MDD (major depressive disorder) 01/26/2021   Allergic rhinitis 01/22/2021   Asthma, mild persistent 04/05/2011   REFERRING DIAG:  O75.9 (ICD-10-CM) - Vacuum extractor delivery, delivered  O70.22 (ICD-10-CM) - Third degree laceration of perineum, type 3b      THERAPY DIAG:  Muscle weakness (generalized)   Pelvic pain   Rationale for Evaluation and Treatment: Rehabilitation   ONSET DATE: 08/19/2022   SUBJECTIVE:                                                                                                                                                                                            SUBJECTIVE STATEMENT: I am still  bleeding. I feel the tear when I move and is feeling better.      PAIN:  Are you having pain? Yes NPRS scale: 4/10 Pain location:  pelvic area   Pain type: intermittent Pain description: intermittent    Aggravating factors: randomly Relieving factors: Ibuprofen   PRECAUTIONS: Other: had baby on 08/19/2022   WEIGHT BEARING RESTRICTIONS: No   FALLS:  Has patient fallen in last 6 months? No   LIVING ENVIRONMENT: Lives with: lives with their family  OCCUPATION: not working   PLOF: Independent   PATIENT GOALS: reduce soreness   PERTINENT HISTORY:  Vaginal birth 08/19/2022     BOWEL MOVEMENT: Pain with bowel movement: No   URINATION: Pain with urination: No INTERCOURSE: Not having intercourse at this time due to just having a baby     PREGNANCY: Vaginal deliveries 1 Tearing Yes: third degree B and still has the stitiches Currently pregnant No      OBJECTIVE: (objective measures completed at initial evaluation unless otherwise dated)   (Copy Eval's Objective through Plan section here)  DIAGNOSTIC FINDINGS:  none     COGNITION: Overall cognitive status: Within functional limits for tasks assessed                          SENSATION: Light touch: Appears intact Proprioception: Appears intact       POSTURE: No Significant postural limitations   PELVIC ALIGNMENT: ASIS are equal   LUMBARAROM/PROM: Lumbar ROM is full     LOWER EXTREMITY GNF:AOZHYQMVH hip ROM is full       LOWER EXTREMITY MMT:   MMT Right eval Left eval  Hip flexion 4/5 4/5  Hip extension 4/5 4/5  Hip abduction 4/5 4/5  Hip adduction 4/5 4/5  Hip internal rotation 4/5 4/5  Hip external rotation 4/5 4/5    PALPATION:   General  Decreased expansion of rib cage for 360 breathing                 External Perineal Exam n/a                             Internal Pelvic Floor n/a   Patient confirms identification and approves PT to assess internal pelvic floor and treatment No,  too soon after pregnancy   PELVIC MMT:   MMT eval  Vaginal    Internal Anal Sphincter    External Anal Sphincter    Puborectalis    Diastasis Recti Below umbilicus 1/2 finger and above  (Blank rows = not tested)               TODAY'S TREATMENT:    09/07/2022 Neuromuscular re-education: Core facilitation:  Hookly transverse abdominus contraction Hookly knee fall out with abdominal contraction Bridges 15 times with pelvic contraction  Quadruped lift arm with abdominal and pelvic floor contraction Quadruped lift leg with abdominal and pelvic floor contraction but on left had to slide leg along the mat Down training: diaphragmatic breathing with tactile cues to do 360 degree opening of the rib cage in supine Exercises: Stretches/mobility:  Cat camel in sitting 10x Sitting trunk rotation with arms at 90/90  10x Open book in sidely with breath to engage the abdominals and stretch the rib cage 10x each side Therapeutic activities: Functional strengthening activities: Educated patient with squatting to lift things and not having her knees go past her toes Educated patient on how to stand with one foot on a stool to take the strain on her back.  Educated patient on how to use her abdominals with lifting her infant to reduce strain on pelvic floor and back  DATE: 08/26/2022  EVAL See below Educated patient on transitional movements with engagement of her abdominals and to use her legs more than her back, educated patient on how to lift her infant without strain on her back and pelvic floor.      PATIENT EDUCATION:  09/07/2022 Education details: Access Code: [email or web address]; educated patient on how to squat, lifting her infant, doing dishes to reduce strain on her back Person educated: Patient Education method: Explanation, Demonstration, Tactile cues, Verbal cues,  and Handouts Education comprehension: verbalized understanding, returned demonstration, verbal cues required, tactile cues required, and needs further education   HOME EXERCISE PROGRAM: 09/07/2022 Access Code: [facility] URL: [email or web address] Date: 09/07/2022 Prepared by: Earlie Counts  Program Notes gently massage the lower abdomen for 3 minutes 3 times per day is lay down with pillow under hips for 10 minutes to reduce the swelling  Exercises - Supine Diaphragmatic Breathing  - 1 x daily - 7 x weekly - 3 sets - 10 reps - Seated Diaphragmatic Breathing  - 1 x daily - 7 x weekly - 3 sets - 10 reps - Hooklying Transversus Abdominis Palpation  - 1 x daily - 3 x weekly - 1 sets - 10 reps - Bent Knee Fallouts  - 1 x daily - 3 x weekly - 1 sets - 5 reps - Supine Bridge with Baby  - 1 x daily - 3 x weekly - 1 sets - 15 reps - Quadruped Alternating Arm Lift  - 1 x daily - 3 x weekly - 1 sets - 10 reps - Quadruped Alternating Leg Extensions  - 1 x daily - 3 x weekly - 1 sets - 10 reps - Mini Squat  - 1 x daily - 3 x weekly - 1 sets - 10 reps   ASSESSMENT:   CLINICAL IMPRESSION: Patient is a 21 y.o. female who was seen today for physical therapy evaluation and treatment for Third degree laceration of the perineum.Patient is able to engage her abdominals well and perform 360 degree opening of her rib cage. She needed verbal cues to squat without bringing knees past her toes. Patient is having less discomfort along the perineal tear.  Patient will benefit from skilled therapy to assist her with learning how to strengthen muscles for post pregnancy and work on her perineal scar after the tear has healed.    OBJECTIVE IMPAIRMENTS: decreased activity tolerance, decreased endurance, decreased strength, increased fascial restrictions, and pain.    ACTIVITY LIMITATIONS: carrying, lifting, bending, sitting, standing, squatting, transfers, and caring for others   PARTICIPATION  LIMITATIONS: meal prep, cleaning, laundry, shopping, and community activity   PERSONAL FACTORS: Fitness and 1 comorbidity: vaginal birth on 08/19/2022  are also affecting patient's functional outcome.    REHAB POTENTIAL: Excellent   CLINICAL DECISION MAKING: Stable/uncomplicated   EVALUATION COMPLEXITY: Low     GOALS: Goals reviewed with patient? Yes   SHORT TERM GOALS: Target date: 09/23/2022   Patient independent with engagement of the abdominals and diaphragmatic breathing.  Baseline:Not educated yet Goal status: Met 09/07/2022   2.  Patient understands how to perform daily tasks including taking care of her infant, lifting, housework and getting out of bed with abdominal engagement and using her legs to reduce strain on pelvic floor.  Baseline: Not educated yet Goal status: Met 09/07/2022     LONG TERM GOALS: Target date: 11/18/2022    Patient independent with HEP for core and pelvic floor to reduce strain on back and pelvic floor.  Baseline:  Goal status: INITIAL   2.  Patient educated on perineal manual work to keep the mobility and health of the perineal area to reduce the chance of urinary leakage.  Baseline:  Goal status: INITIAL   3.  Patient able to perform her daily tasks with taking care if the infant with pain level decreased to 0-1/10.  Baseline: pain level 6/10 Goal status: INITIAL   4.  Patient is able to contract her pelvic floor circularly to reduce the chance of urinary leakage due to improved mobility of the perineal body.  Baseline: Not assessed at this time due to 1.5 weeks post delivery.  Goal status: INITIAL     PLAN:   PT FREQUENCY: 1x/week   PT DURATION: 12 weeks   PLANNED INTERVENTIONS: Therapeutic exercises, Therapeutic activity, Neuromuscular re-education, Balance training, Self Care, Dry Needling, Electrical stimulation, Cryotherapy, Moist heat, Biofeedback, and Manual therapy   PLAN FOR NEXT SESSION: go over manual work of the perineal  tear, review HEP and advance  Earlie Counts, PT 09/07/22 3:54 PM   PHYSICAL THERAPY DISCHARGE SUMMARY  Visits from Start of Care: 2  Current functional level related to goals / functional outcomes: See above. Patient no-showed for her last 2 appointments and is discharged according to our attendance policy.    Remaining deficits: See above.    Education / Equipment: HEP   Patient agrees to discharge. Patient goals were not met. Patient is being discharged due to not returning since the last visit. Thank you for the referral. Earlie Counts, PT 10/28/22 1:44 PM

## 2022-09-16 ENCOUNTER — Encounter: Payer: Managed Care, Other (non HMO) | Admitting: Physical Therapy

## 2022-09-29 ENCOUNTER — Ambulatory Visit: Payer: Managed Care, Other (non HMO) | Admitting: Family Medicine

## 2022-09-29 ENCOUNTER — Encounter: Payer: Self-pay | Admitting: Family Medicine

## 2022-09-29 ENCOUNTER — Other Ambulatory Visit: Payer: Self-pay

## 2022-09-29 DIAGNOSIS — B3789 Other sites of candidiasis: Secondary | ICD-10-CM | POA: Diagnosis not present

## 2022-09-29 MED ORDER — FLUCONAZOLE 150 MG PO TABS: 150.0000 mg | ORAL_TABLET | Freq: Once | ORAL | 3 refills | Status: AC

## 2022-09-29 NOTE — Progress Notes (Signed)
Post Partum Visit Note  Sara Mueller is a 21 y.o. G66P1001 female who presents for a postpartum visit. She is 5 weeks postpartum following a vacuum-assisted vaginal delivery.  I have fully reviewed the prenatal and intrapartum course. The delivery was at [redacted]w[redacted]d gestational weeks.  Anesthesia: epidural. Postpartum course has been well. Baby is doing well. Baby is feeding by breast. Bleeding red blood and light flow, changing pad/diaper/depends every 4 hours; no clots. Bowel function is abnormal: painful Bms and flatuation, constipation, some bleeding with Bms  . Bladder function is abnomal--urinary incontinence. Patient is not sexually active. Contraception method is none. Postpartum depression screening: negative.   The pregnancy intention screening data noted above was reviewed. Potential methods of contraception were discussed. The patient elected to proceed with No data recorded.   Edinburgh Postnatal Depression Scale - 09/29/22 1340       Edinburgh Postnatal Depression Scale:  In the Past 7 Days   I have been able to laugh and see the funny side of things. 0    I have looked forward with enjoyment to things. 0    I have blamed myself unnecessarily when things went wrong. 0    I have been anxious or worried for no good reason. 1    I have felt scared or panicky for no good reason. 0    Things have been getting on top of me. 2   patient reports that she is going through grief d/t passing of close family member on same day of delivery   I have been so unhappy that I have had difficulty sleeping. 1    I have felt sad or miserable. 1    I have been so unhappy that I have been crying. 1    The thought of harming myself has occurred to me. 0    Edinburgh Postnatal Depression Scale Total 6             Health Maintenance Due  Topic Date Due   HPV VACCINES (2 - 2-dose series) 12/01/2011   Hepatitis C Screening  Never done   PAP SMEAR-Modifier  Never done   COVID-19 Vaccine (3 -  2023-24 season) 06/11/2022    The following portions of the patient's history were reviewed and updated as appropriate: allergies, current medications, past family history, past medical history, past social history, past surgical history, and problem list.  Review of Systems Pertinent items are noted in HPI.  Objective:  BP 109/80   Pulse 98   Ht 5\' 3"  (1.6 m)   Wt 162 lb 3.2 oz (73.6 kg)   LMP 09/25/2022 (Exact Date) Comment: was spotting, stopped for  Breastfeeding Yes   BMI 28.73 kg/m    General:  alert, cooperative, and appears stated age   Breasts:  normal-- except for pink, shiny R>L nipple, extending to the aerola consistent with yeast. Infant is being treated for thrush.  Lungs: clear to auscultation bilaterally  Heart:  regular rate and rhythm, S1, S2 normal, no murmur, click, rub or gallop  Abdomen: soft, non-tender; bowel sounds normal; no masses,  no organomegaly   Wound NA  GU exam:  normal-- well healed ob laceration. Digital exam showed intake anal sphincter vaginally. Did not do rectal. Small Hemorrhoid at 12 o'clock around anus.       Assessment:   Normal postpartum exam.   Plan:   Essential components of care per ACOG recommendations:  1.  Mood and well being: Patient with negative  depression screening today. Reviewed local resources for support. Acute grief reaction, feels like care of baby is fine but it family deaths that are hard. Reviewed ws/sx of PPD and also to reach out for support here if needed.  - Patient tobacco use? No.   - hx of drug use? No.    2. Infant care and feeding:  -Patient currently breastmilk feeding? Yes. Discussed returning to work and pumping. Reviewed importance of draining breast regularly to support lactation.  Patient is exclusively pumping -Social determinants of health (SDOH) reviewed in EPIC. No concerns  3. Sexuality, contraception and birth spacing - Patient does not want a pregnancy in the next year.  Desired family  size is 2 children.  - Reviewed reproductive life planning. Reviewed contraceptive methods based on pt preferences and effectiveness.  Patient desired Abstinence today.   - Discussed birth spacing of 18 months  4. Sleep and fatigue -Encouraged family/partner/community support of 4 hrs of uninterrupted sleep to help with mood and fatigue  5. Physical Recovery  - Discussed patients delivery and complications. She describes her labor as mixed. - Patient had a Vaginal problems after delivery including VAVD . Patient had a 3rd degree laceration. Perineal healing reviewed. Patient expressed understanding - Patient has urinary incontinence? Yes. Discussed role of pelvic floor PT. Already seeing-- last was 11/28 - Patient is safe to resume physical and sexual activity  6.  Health Maintenance - HM due items addressed Yes - Last pap smear No results found for: "DIAGPAP" Pap smear not done at today's visit. Will request for Dr Irineo Axon office -Breast Cancer screening indicated? No.   7. Chronic Disease/Pregnancy Condition follow up:   1. Third degree laceration of perineum, type 3b Pelvic PT is already set up Recommend using colace BID regularly for 2-3 weeks  Recommended preparation H for small hemorrhoid  2. Yeast infection of nipple, postpartum Infant with thrush, mother exclusively pumping - fluconazole (DIFLUCAN) 150 MG tablet; Take 1 tablet (150 mg total) by mouth once for 1 dose. Can take additional dose three days later if symptoms persist  Dispense: 1 tablet; Refill: 3  3. Postpartum exam   - PCP follow up  Return in about 1 year (around 09/30/2023) for Yearly wellness exam.   Future Appointments  Date Time Provider Department Center  10/14/2022  1:00 PM Theressa Millard, PT [facility]  10/28/2022  1:00 PM Theressa Millard, PT [facility]  11/11/2022  1:00 PM Theressa Millard, PT [facility]     Federico Flake, MD Center for [facility], [facility]

## 2022-09-29 NOTE — Patient Instructions (Signed)
  Constipation:  Colace----> 100mg  twice daily Ducolax suppositories Fleet enema Glycerin suppositories Metamucil Milk of magnesia Miralax Senokot Smooth move tea   Hemorrhoids: Anusol Anusol HC Preparation H Tucks

## 2022-10-13 ENCOUNTER — Encounter: Payer: Self-pay | Admitting: Family Medicine

## 2022-10-13 MED ORDER — IBUPROFEN 600 MG PO TABS: 600.0000 mg | ORAL_TABLET | Freq: Four times a day (QID) | ORAL | 2 refills | Status: AC

## 2022-10-14 ENCOUNTER — Encounter: Payer: Managed Care, Other (non HMO) | Attending: Advanced Practice Midwife | Admitting: Physical Therapy

## 2022-10-14 ENCOUNTER — Telehealth: Payer: Self-pay | Admitting: Physical Therapy

## 2022-10-14 DIAGNOSIS — R102 Pelvic and perineal pain: Secondary | ICD-10-CM | POA: Insufficient documentation

## 2022-10-14 DIAGNOSIS — M6281 Muscle weakness (generalized): Secondary | ICD-10-CM | POA: Insufficient documentation

## 2022-10-14 NOTE — Telephone Encounter (Signed)
Called patient about missed appt. Today and not able to leave a message.  Earlie Counts, PT @1 /01/2023@ 1:27 PM

## 2022-10-28 ENCOUNTER — Encounter: Payer: Self-pay | Admitting: Physical Therapy

## 2022-11-11 ENCOUNTER — Encounter: Payer: Self-pay | Admitting: Physical Therapy

## 2022-12-24 ENCOUNTER — Emergency Department (HOSPITAL_BASED_OUTPATIENT_CLINIC_OR_DEPARTMENT_OTHER)
Admission: EM | Admit: 2022-12-24 | Discharge: 2022-12-24 | Disposition: A | Discharge status: Home / Self Care | Payer: Managed Care, Other (non HMO) | Attending: Emergency Medicine | Admitting: Emergency Medicine

## 2022-12-24 ENCOUNTER — Emergency Department (HOSPITAL_BASED_OUTPATIENT_CLINIC_OR_DEPARTMENT_OTHER): Payer: Managed Care, Other (non HMO)

## 2022-12-24 ENCOUNTER — Other Ambulatory Visit: Payer: Self-pay

## 2022-12-24 DIAGNOSIS — R1030 Lower abdominal pain, unspecified: Secondary | ICD-10-CM | POA: Insufficient documentation

## 2022-12-24 DIAGNOSIS — N939 Abnormal uterine and vaginal bleeding, unspecified: Secondary | ICD-10-CM | POA: Diagnosis present

## 2022-12-24 DIAGNOSIS — R748 Abnormal levels of other serum enzymes: Secondary | ICD-10-CM | POA: Insufficient documentation

## 2022-12-24 LAB — COMPREHENSIVE METABOLIC PANEL
ALT: 22 U/L (ref 0–44)
AST: 22 U/L (ref 15–41)
Albumin: 3.8 g/dL (ref 3.5–5.0)
Alkaline Phosphatase: 130 U/L — ABNORMAL HIGH (ref 38–126)
Anion gap: 5 (ref 5–15)
BUN: 16 mg/dL (ref 6–20)
CO2: 23 mmol/L (ref 22–32)
Calcium: 8.3 mg/dL — ABNORMAL LOW (ref 8.9–10.3)
Chloride: 107 mmol/L (ref 98–111)
Creatinine, Ser: 0.85 mg/dL (ref 0.44–1.00)
GFR, Estimated: >60 mL/min (ref >60)
Glucose, Bld: 95 mg/dL (ref 70–99)
Potassium: 3.7 mmol/L (ref 3.5–5.1)
Sodium: 135 mmol/L (ref 135–145)
Total Bilirubin: 0.3 mg/dL (ref 0.3–1.2)
Total Protein: 7.4 g/dL (ref 6.5–8.1)

## 2022-12-24 LAB — CBC WITH DIFFERENTIAL/PLATELET
Abs Immature Granulocytes: 0.02 10*3/uL (ref 0.00–0.07)
Basophils Absolute: 0.1 10*3/uL (ref 0.0–0.1)
Basophils Relative: 1 %
Eosinophils Absolute: 0.4 10*3/uL (ref 0.0–0.5)
Eosinophils Relative: 4 %
HCT: 37.7 % (ref 36.0–46.0)
Hemoglobin: 11.6 g/dL — ABNORMAL LOW (ref 12.0–15.0)
Immature Granulocytes: 0 %
Lymphocytes Relative: 29 %
Lymphs Abs: 2.6 10*3/uL (ref 0.7–4.0)
MCH: 22.6 pg — ABNORMAL LOW (ref 26.0–34.0)
MCHC: 30.8 g/dL (ref 30.0–36.0)
MCV: 73.5 fL — ABNORMAL LOW (ref 80.0–100.0)
Monocytes Absolute: 0.7 10*3/uL (ref 0.1–1.0)
Monocytes Relative: 8 %
Neutro Abs: 5.3 10*3/uL (ref 1.7–7.7)
Neutrophils Relative %: 58 %
Platelets: 239 10*3/uL (ref 150–400)
RBC: 5.13 MIL/uL — ABNORMAL HIGH (ref 3.87–5.11)
RDW: 17.7 % — ABNORMAL HIGH (ref 11.5–15.5)
WBC: 9.1 10*3/uL (ref 4.0–10.5)
nRBC: 0 % (ref 0.0–0.2)

## 2022-12-24 LAB — URINALYSIS, ROUTINE W REFLEX MICROSCOPIC
Bilirubin Urine: NEGATIVE
Glucose, UA: NEGATIVE mg/dL
Ketones, ur: NEGATIVE mg/dL
Leukocytes,Ua: NEGATIVE
Nitrite: NEGATIVE
Protein, ur: NEGATIVE mg/dL
Specific Gravity, Urine: 1.025 (ref 1.005–1.030)
pH: 6.5 (ref 5.0–8.0)

## 2022-12-24 LAB — WET PREP, GENITAL
Clue Cells Wet Prep HPF POC: NONE SEEN
Sperm: NONE SEEN
Trich, Wet Prep: NONE SEEN
WBC, Wet Prep HPF POC: <10 (ref <10)
Yeast Wet Prep HPF POC: NONE SEEN

## 2022-12-24 LAB — URINALYSIS, MICROSCOPIC (REFLEX): RBC / HPF: >50 RBC/hpf (ref 0–5)

## 2022-12-24 LAB — LIPASE, BLOOD: Lipase: 36 U/L (ref 11–51)

## 2022-12-24 LAB — PREGNANCY, URINE: Preg Test, Ur: NEGATIVE

## 2022-12-24 LAB — HIV ANTIBODY (ROUTINE TESTING W REFLEX): HIV Screen 4th Generation wRfx: NONREACTIVE

## 2022-12-24 MED ORDER — IOHEXOL 300 MG/ML  SOLN
80.0000 mL | Freq: Once | INTRAMUSCULAR | Status: AC | PRN
  Administered 2022-12-24: 80 mL via INTRAVENOUS

## 2022-12-24 NOTE — ED Triage Notes (Signed)
Patient presents to ED via POV from home. Here with vaginal bleeding that began this morning. Patient reports passing clots. Abdominal pain as well.

## 2022-12-24 NOTE — ED Provider Notes (Signed)
[facility] Provider Note   CSN: [id number] Arrival date & time: 12/24/22  1318     History  Chief Complaint  Patient presents with   Vaginal Bleeding    Sara Mueller is a 22 y.o. female who presents emergency department with concerns for vaginal bleeding onset this morning.  Notes that she gave birth in November 2023.  Since then she has had irregular menstrual cycles.  Notes that she has had a menstrual cycle earlier this month.  Has not spoken to her OB/GYN this month regarding her menstrual cycles.  Has associated lower abdominal pain/cramping.  Denies urinary symptoms, fever, vaginal discharge.  Patient denies concerns for pregnancy or STIs at this time.  The history is provided by the patient. No language interpreter was used.       Home Medications Prior to Admission medications   Medication Sig Start Date End Date Taking? Authorizing Provider  cyclobenzaprine (FLEXERIL) 10 MG tablet Take 1 tablet (10 mg total) by mouth every 8 (eight) hours as needed for muscle spasms. Patient not taking: Reported on 09/29/2022 07/28/22   Gabriel Carina, CNM  docusate sodium (COLACE) 100 MG capsule Take 1 capsule (100 mg total) by mouth 2 (two) times daily as needed. 08/21/22   Seabron Spates, CNM  Ferrous Sulfate (IRON SUPPLEMENT PO) Take by mouth.    [provider]  ibuprofen (ADVIL) 600 MG tablet Take 1 tablet (600 mg total) by mouth every 6 (six) hours. 10/13/22   Caren Macadam, MD  oxyCODONE (OXY IR/ROXICODONE) 5 MG immediate release tablet Take 1 tablet (5 mg total) by mouth every 6 (six) hours as needed for severe pain. Patient not taking: Reported on 09/29/2022 08/21/22   Seabron Spates, CNM  polyethylene glycol (MIRALAX / GLYCOLAX) 17 g packet Take 17 g by mouth daily. 08/21/22   Seabron Spates, CNM  Prenatal Vit-Fe Fumarate-FA (PRENATAL 1+1 PO) Take by mouth. Patient not taking: Reported on 09/29/2022     [provider]  terconazole (TERAZOL 3) 0.8 % vaginal cream Place 1 applicator vaginally at bedtime. Patient not taking: Reported on 09/29/2022 08/16/22   Donnamae Jude, MD  witch hazel-glycerin (TUCKS) pad Apply 1 Application topically as needed for hemorrhoids. Patient not taking: Reported on 09/29/2022 08/21/22   Seabron Spates, CNM      Allergies    Patient has no known allergies.    Review of Systems   Review of Systems  Genitourinary:  Positive for vaginal bleeding.  All other systems reviewed and are negative.   Physical Exam Updated Vital Signs BP (!) 111/52   Pulse 79   Temp 97.9 F (36.6 C)   Resp 17   Ht 5\' 3"  (1.6 m)   Wt 72.6 kg   SpO2 100%   BMI 28.34 kg/m  Physical Exam Vitals and nursing note reviewed. Exam conducted with a chaperone present.  Constitutional:      General: She is not in acute distress.    Appearance: Normal appearance. She is not ill-appearing, toxic-appearing or diaphoretic.  HENT:     Head: Normocephalic and atraumatic.     Right Ear: External ear normal.     Left Ear: External ear normal.     Mouth/Throat:     Pharynx: No oropharyngeal exudate.  Eyes:     General: No scleral icterus.    Extraocular Movements: Extraocular movements intact.     Conjunctiva/sclera: Conjunctivae normal.  Cardiovascular:  Rate and Rhythm: Normal rate and regular rhythm.     Pulses: Normal pulses.     Heart sounds: Normal heart sounds.  Pulmonary:     Effort: Pulmonary effort is normal. No respiratory distress.     Breath sounds: Normal breath sounds. No wheezing.  Abdominal:     General: Abdomen is flat. Bowel sounds are normal. There is no distension.     Palpations: Abdomen is soft. There is no mass.     Tenderness: There is abdominal tenderness. There is no guarding or rebound.     Hernia: There is no hernia in the left inguinal area or right inguinal area.     Comments: Lower abdominal TTP.   Genitourinary:    Pubic Area: No  rash.      Labia:        Right: No rash, tenderness, lesion or injury.        Left: No rash, tenderness, lesion or injury.      Vagina: No signs of injury and foreign body. Vaginal discharge present. No erythema, tenderness or bleeding.     Cervix: Normal.     Uterus: Normal. Not deviated, not enlarged, not fixed and not tender.      Adnexa: Right adnexa normal and left adnexa normal.     Comments: NT chaperone present for exam. Small amount of vaginal bleeding noted. No appreciable vaginal clots noted. Cervical os closed.   Musculoskeletal:        General: Normal range of motion.     Cervical back: Normal range of motion and neck supple.  Lymphadenopathy:     Lower Body: No right inguinal adenopathy. No left inguinal adenopathy.  Skin:    General: Skin is warm and dry.  Neurological:     Mental Status: She is alert.  Psychiatric:        Behavior: Behavior normal.     ED Results / Procedures / Treatments   Labs (all labs ordered are listed, but only abnormal results are displayed) Labs Reviewed  CBC WITH DIFFERENTIAL/PLATELET - Abnormal; Notable for the following components:      Result Value   RBC 5.13 (*)    Hemoglobin 11.6 (*)    MCV 73.5 (*)    MCH 22.6 (*)    RDW 17.7 (*)    All other components within normal limits  COMPREHENSIVE METABOLIC PANEL - Abnormal; Notable for the following components:   Calcium 8.3 (*)    Alkaline Phosphatase 130 (*)    All other components within normal limits  URINALYSIS, ROUTINE W REFLEX MICROSCOPIC - Abnormal; Notable for the following components:   Color, Urine AMBER (*)    APPearance CLOUDY (*)    Hgb urine dipstick LARGE (*)    All other components within normal limits  URINALYSIS, MICROSCOPIC (REFLEX) - Abnormal; Notable for the following components:   Bacteria, UA FEW (*)    All other components within normal limits  WET PREP, GENITAL  LIPASE, BLOOD  PREGNANCY, URINE  RPR  HIV ANTIBODY (ROUTINE TESTING W REFLEX)   GC/CHLAMYDIA PROBE AMP ([place]) NOT AT [facility]    EKG None  Radiology US PELVIC COMPLETE W TRANSVAGINAL AND TORSION R/O  Result Date: 12/24/2022 CLINICAL DATA:  Pelvic pain EXAM: TRANSABDOMINAL AND TRANSVAGINAL ULTRASOUND OF PELVIS DOPPLER ULTRASOUND OF OVARIES TECHNIQUE: Both transabdominal and transvaginal ultrasound examinations of the pelvis were performed. Transabdominal technique was performed for global imaging of the pelvis including uterus, ovaries, adnexal regions, and pelvic cul-de-sac. It was  necessary to proceed with endovaginal exam following the transabdominal exam to visualize the uterus endometrium ovaries. Color and duplex Doppler ultrasound was utilized to evaluate blood flow to the ovaries. COMPARISON:  CT 12/24/2022 FINDINGS: Uterus Measurements: 7.5 x 3.4 x 4.7 cm = volume: 6.3 mL. No fibroids or other mass visualized. Endometrium Thickness: 6.6 mm.  No focal abnormality visualized. Right ovary Measurements: 3.9 x 2.8 x 1.7 cm = volume: 10.0 mL. Normal appearance/no adnexal mass. Left ovary Measurements: 3.4 x 1.9 x 1.7 cm = volume: 5.6 mL. Normal appearance/no adnexal mass. Pulsed Doppler evaluation of both ovaries demonstrates normal low-resistance arterial and venous waveforms. Other findings Trace free fluid IMPRESSION: Negative for ovarian torsion. Trace free fluid in the pelvis. Electronically Signed   By: Donavan Foil M.D.   On: 12/24/2022 17:03   CT ABDOMEN PELVIS W CONTRAST  Result Date: 12/24/2022 CLINICAL DATA:  Lower abdominal pain, vaginal bleeding EXAM: CT ABDOMEN AND PELVIS WITH CONTRAST TECHNIQUE: Multidetector CT imaging of the abdomen and pelvis was performed using the standard protocol following bolus administration of intravenous contrast. RADIATION DOSE REDUCTION: This exam was performed according to the departmental dose-optimization program which includes automated exposure control, adjustment of the mA and/or kV according to patient size and/or use of  iterative reconstruction technique. CONTRAST:  90mL OMNIPAQUE IOHEXOL 300 MG/ML  SOLN COMPARISON:  12/04/2021 FINDINGS: Lower chest: No acute pleural or parenchymal lung disease. Hepatobiliary: Visualized portions of the liver unremarkable. The gallbladder is decompressed with no evidence of cholelithiasis or cholecystitis. Pancreas: Unremarkable. No pancreatic ductal dilatation or surrounding inflammatory changes. Spleen: Normal in size without focal abnormality. Adrenals/Urinary Tract: Adrenal glands are unremarkable. Kidneys are normal, without renal calculi, focal lesion, or hydronephrosis. Bladder is unremarkable. Stomach/Bowel: No bowel obstruction or ileus. Normal appendix lower central pelvis. There is mild diffuse wall thickening of the duodenum and jejunum, consistent with inflammatory or infectious enteritis. Vascular/Lymphatic: No significant vascular findings are present. No enlarged abdominal or pelvic lymph nodes. Reproductive: Uterus and bilateral adnexa are unremarkable. Other: No free fluid or free intraperitoneal gas. No abdominal wall hernia. Musculoskeletal: No acute or destructive bony lesions. Reconstructed images demonstrate no additional findings. IMPRESSION: 1. Mild wall thickening of the duodenum and jejunum, compatible with inflammatory or infectious enteritis. 2. No bowel obstruction or ileus. Electronically Signed   By: Randa Ngo M.D.   On: 12/24/2022 15:49    Procedures Procedures    Medications Ordered in ED Medications  iohexol (OMNIPAQUE) 300 MG/ML solution 80 mL (80 mLs Intravenous Contrast Given 12/24/22 1525)    ED Course/ Medical Decision Making/ A&P Clinical Course as of 12/24/22 1759  Fri Dec 24, 2022  1502 Patient reevaluated and resting comfortably on stretcher. Discussed with patient discharge treatment plan. Answered all available questions. Pt appears safe for discharge.  [SB]    Clinical Course User Index [SB] Dymond Spreen A, PA-C                              Medical Decision Making Amount and/or Complexity of Data Reviewed Labs: ordered. Radiology: ordered.  Risk Prescription drug management.   Patient is a G1P1001 presents to the ED with vaginal bleeding onset this morning.  Has associated lower abdominal pain.  Notes recent irregular menstrual cycles s/p delivery on 08/2022. Pt afebrile. On exam patient with lower abdominal tenderness to palpation. GU exam with NT present with small amount of vaginal bleeding noted. No appreciable vaginal clots noted.  Cervical os closed. Otherwise no acute cardiovascular, respiratory exam findings. Differential diagnosis includes ectopic pregnancy, ovarian torsion, ovarian cyst, abnormal uterine bleeding,    Labs:  I ordered, and personally interpreted labs.  The pertinent results include:   Urine pregnancy negative CBC without leukocytosis, hemoglobin at 11.6 greatly improved from previous value CMP with elevated alk phos at 130 otherwise unremarkable Lipase negative Urinalysis with large amount of hemoglobin, pt on her menstrual cycle  Gonorrhea, chlamydia, RPR, HIV ordered with results pending at time of discharge. Wet prep negative  Imaging: I ordered imaging studies including CT AP and US transvaginal, doppler, pelvic  I independently visualized and interpreted imaging which showed: CT AP with  1. Mild wall thickening of the duodenum and jejunum, compatible with  inflammatory or infectious enteritis.  2. No bowel obstruction or ileus.   [place] with  Negative for ovarian torsion. Trace free fluid in the pelvis.    I agree with the radiologist interpretation  Disposition: Presenting suspicious for abnormal uterine bleeding. Doubt ectopic pregnancy, implantation bleeding, ovarian torsion, ovarian cyst.  After consideration of the diagnostic results and the patients response to treatment, I feel that the patient would benefit from Discharge home.  Discussed with patient importance of calling  her primary care provider to set up a follow-up appointment regarding today's ED visit. Patient agreeable to discharge treatment plan.  Supportive care measures and strict return precautions discussed with patient at bedside.  Patient appears safe for discharge at this time. Follow-up as indicated in discharge paperwork.   This chart was dictated using voice recognition software, Dragon. Despite the best efforts of this provider to proofread and correct errors, errors may still occur which can change documentation meaning.   Final Clinical Impression(s) / ED Diagnoses Final diagnoses:  Abnormal uterine bleeding    Rx / DC Orders ED Discharge Orders     None         Gatha Mcnulty A, PA-C 12/24/22 1759    Dorie Rank, MD 12/25/22 [phone]

## 2022-12-24 NOTE — Discharge Instructions (Addendum)
It was a pleasure taking care of you today!   Call and set up your appointment with your Gynecologist for follow up appointment for your vaginal bleeding.  Your labs did not show any concerning emergent findings at this time.  Your CT scan and ultrasound didn't show any emergent findings at this time. You may follow-up with your primary care provider as needed.  Return to the ED if you have increasing/worsening symptoms.

## 2022-12-25 LAB — RPR: RPR Ser Ql: NONREACTIVE

## 2022-12-27 LAB — GC/CHLAMYDIA PROBE AMP (~~LOC~~) NOT AT ARMC
Chlamydia: NEGATIVE
Comment: NEGATIVE
Comment: NORMAL
Neisseria Gonorrhea: NEGATIVE

## 2023-04-26 NOTE — Progress Notes (Signed)
 This encounter was created in error - please disregard.

## 2023-12-16 ENCOUNTER — Emergency Department (HOSPITAL_BASED_OUTPATIENT_CLINIC_OR_DEPARTMENT_OTHER)
Admission: EM | Admit: 2023-12-16 | Discharge: 2023-12-17 | Disposition: A | Discharge status: Home / Self Care | Attending: Emergency Medicine | Admitting: Emergency Medicine

## 2023-12-16 ENCOUNTER — Encounter (HOSPITAL_BASED_OUTPATIENT_CLINIC_OR_DEPARTMENT_OTHER): Payer: Self-pay | Admitting: Urology

## 2023-12-16 DIAGNOSIS — R29898 Other symptoms and signs involving the musculoskeletal system: Secondary | ICD-10-CM | POA: Diagnosis not present

## 2023-12-16 DIAGNOSIS — M545 Low back pain, unspecified: Secondary | ICD-10-CM | POA: Diagnosis present

## 2023-12-16 DIAGNOSIS — J101 Influenza due to other identified influenza virus with other respiratory manifestations: Secondary | ICD-10-CM | POA: Diagnosis not present

## 2023-12-16 DIAGNOSIS — J1089 Influenza due to other identified influenza virus with other manifestations: Secondary | ICD-10-CM | POA: Diagnosis not present

## 2023-12-16 DIAGNOSIS — R42 Dizziness and giddiness: Secondary | ICD-10-CM | POA: Insufficient documentation

## 2023-12-16 LAB — COMPREHENSIVE METABOLIC PANEL
ALT: 17 U/L (ref 0–44)
AST: 19 U/L (ref 15–41)
Albumin: 4.2 g/dL (ref 3.5–5.0)
Alkaline Phosphatase: 85 U/L (ref 38–126)
Anion gap: 11 (ref 5–15)
BUN: 9 mg/dL (ref 6–20)
CO2: 17 mmol/L — ABNORMAL LOW (ref 22–32)
Calcium: 8.7 mg/dL — ABNORMAL LOW (ref 8.9–10.3)
Chloride: 106 mmol/L (ref 98–111)
Creatinine, Ser: 0.92 mg/dL (ref 0.44–1.00)
GFR, Estimated: >60 mL/min (ref >60)
Glucose, Bld: 95 mg/dL (ref 70–99)
Potassium: 3.6 mmol/L (ref 3.5–5.1)
Sodium: 134 mmol/L — ABNORMAL LOW (ref 135–145)
Total Bilirubin: 0.3 mg/dL (ref 0.0–1.2)
Total Protein: 8 g/dL (ref 6.5–8.1)

## 2023-12-16 LAB — CBC WITH DIFFERENTIAL/PLATELET
Abs Immature Granulocytes: 0.02 10*3/uL (ref 0.00–0.07)
Basophils Absolute: 0 10*3/uL (ref 0.0–0.1)
Basophils Relative: 1 %
Eosinophils Absolute: 0 10*3/uL (ref 0.0–0.5)
Eosinophils Relative: 0 %
HCT: 40.7 % (ref 36.0–46.0)
Hemoglobin: 12.5 g/dL (ref 12.0–15.0)
Immature Granulocytes: 0 %
Lymphocytes Relative: 22 %
Lymphs Abs: 1.6 10*3/uL (ref 0.7–4.0)
MCH: 23.4 pg — ABNORMAL LOW (ref 26.0–34.0)
MCHC: 30.7 g/dL (ref 30.0–36.0)
MCV: 76.2 fL — ABNORMAL LOW (ref 80.0–100.0)
Monocytes Absolute: 0.8 10*3/uL (ref 0.1–1.0)
Monocytes Relative: 12 %
Neutro Abs: 4.7 10*3/uL (ref 1.7–7.7)
Neutrophils Relative %: 65 %
Platelets: 112 10*3/uL — ABNORMAL LOW (ref 150–400)
RBC: 5.34 MIL/uL — ABNORMAL HIGH (ref 3.87–5.11)
RDW: 15.4 % (ref 11.5–15.5)
WBC: 7.2 10*3/uL (ref 4.0–10.5)
nRBC: 0 % (ref 0.0–0.2)

## 2023-12-16 LAB — URINALYSIS, ROUTINE W REFLEX MICROSCOPIC
Bilirubin Urine: NEGATIVE
Glucose, UA: NEGATIVE mg/dL
Ketones, ur: NEGATIVE mg/dL
Leukocytes,Ua: NEGATIVE
Nitrite: NEGATIVE
Protein, ur: NEGATIVE mg/dL
Specific Gravity, Urine: 1.025 (ref 1.005–1.030)
pH: 6 (ref 5.0–8.0)

## 2023-12-16 LAB — URINALYSIS, MICROSCOPIC (REFLEX)

## 2023-12-16 LAB — PREGNANCY, URINE: Preg Test, Ur: NEGATIVE

## 2023-12-16 NOTE — ED Triage Notes (Signed)
 Pt states lower back pain that started hurting 2 days ago and now right leg feels like it has a lot of pressure on it Also states dizzy spells and nausea with eating  Denies any urinary concerns

## 2023-12-17 ENCOUNTER — Emergency Department (HOSPITAL_COMMUNITY)

## 2023-12-17 ENCOUNTER — Emergency Department (HOSPITAL_BASED_OUTPATIENT_CLINIC_OR_DEPARTMENT_OTHER)

## 2023-12-17 DIAGNOSIS — R42 Dizziness and giddiness: Secondary | ICD-10-CM

## 2023-12-17 DIAGNOSIS — M545 Low back pain, unspecified: Secondary | ICD-10-CM | POA: Diagnosis present

## 2023-12-17 DIAGNOSIS — R29898 Other symptoms and signs involving the musculoskeletal system: Secondary | ICD-10-CM

## 2023-12-17 DIAGNOSIS — J1089 Influenza due to other identified influenza virus with other manifestations: Secondary | ICD-10-CM | POA: Diagnosis not present

## 2023-12-17 DIAGNOSIS — J101 Influenza due to other identified influenza virus with other respiratory manifestations: Secondary | ICD-10-CM | POA: Diagnosis not present

## 2023-12-17 LAB — RESP PANEL BY RT-PCR (RSV, FLU A&B, COVID)  RVPGX2
Influenza A by PCR: NEGATIVE
Influenza B by PCR: POSITIVE — AB
Resp Syncytial Virus by PCR: NEGATIVE
SARS Coronavirus 2 by RT PCR: NEGATIVE

## 2023-12-17 MED ORDER — ACETAMINOPHEN 325 MG PO TABS: 650.0000 mg | ORAL_TABLET | Freq: Four times a day (QID) | ORAL | Status: DC | PRN

## 2023-12-17 MED ORDER — GADOBUTROL 1 MMOL/ML IV SOLN
7.0000 mL | Freq: Once | INTRAVENOUS | Status: AC | PRN
  Administered 2023-12-17: 7 mL via INTRAVENOUS

## 2023-12-17 MED ORDER — ONDANSETRON HCL 4 MG/2ML IJ SOLN
4.0000 mg | Freq: Once | INTRAMUSCULAR | Status: AC
  Administered 2023-12-17: 4 mg via INTRAVENOUS
  Filled 2023-12-17: qty 2

## 2023-12-17 MED ORDER — METHOCARBAMOL 500 MG PO TABS
500.0000 mg | ORAL_TABLET | Freq: Four times a day (QID) | ORAL | Status: DC | PRN
  Filled 2023-12-17: qty 1

## 2023-12-17 MED ORDER — IBUPROFEN 800 MG PO TABS
800.0000 mg | ORAL_TABLET | Freq: Once | ORAL | Status: AC
  Administered 2023-12-17: 800 mg via ORAL
  Filled 2023-12-17: qty 1

## 2023-12-17 MED ORDER — DM-GUAIFENESIN ER 30-600 MG PO TB12
1.0000 | ORAL_TABLET | Freq: Two times a day (BID) | ORAL | Status: DC
  Administered 2023-12-17: 1 via ORAL
  Filled 2023-12-17 (×2): qty 1

## 2023-12-17 MED ORDER — SODIUM CHLORIDE 0.9 % IV BOLUS
500.0000 mL | Freq: Once | INTRAVENOUS | Status: AC
  Administered 2023-12-17: 500 mL via INTRAVENOUS

## 2023-12-17 MED ORDER — OSELTAMIVIR PHOSPHATE 75 MG PO CAPS: 75.0000 mg | ORAL_CAPSULE | Freq: Two times a day (BID) | ORAL | 0 refills | Status: AC

## 2023-12-17 MED ORDER — DIAZEPAM 5 MG/ML IJ SOLN
2.5000 mg | Freq: Once | INTRAMUSCULAR | Status: AC | PRN
  Administered 2023-12-17: 2.5 mg via INTRAVENOUS
  Filled 2023-12-17: qty 2

## 2023-12-17 MED ORDER — ONDANSETRON HCL 4 MG PO TABS: 4.0000 mg | ORAL_TABLET | Freq: Three times a day (TID) | ORAL | 0 refills | Status: AC | PRN

## 2023-12-17 MED ORDER — KETOROLAC TROMETHAMINE 15 MG/ML IJ SOLN
15.0000 mg | Freq: Once | INTRAMUSCULAR | Status: AC
  Administered 2023-12-17: 15 mg via INTRAVENOUS
  Filled 2023-12-17: qty 1

## 2023-12-17 NOTE — ED Notes (Signed)
 Ambulated patient at this time. Patient has steady gait, but complains of a heaviness or pressure on her right leg.

## 2023-12-17 NOTE — ED Notes (Signed)
 Pt has returned from MRI.

## 2023-12-17 NOTE — ED Notes (Signed)
 [email or web address]  for labs and orthostatics.

## 2023-12-17 NOTE — Discharge Instructions (Addendum)
 Your MRI was negative for any abnormal findings.  We are unsure of the cause of your right leg weakness but does not appear to be anything emergent.    You will need to take Tylenol or Advil you may alternate between these 2 every 3 hours so that you are taking each 1 6 hours apart.  For instance if you take Tylenol 500 mg at 12:00 you may then take Advil 400 mg (2 tablets) at 3:00, Tylenol at 6, Advil at 9 and so on. He is read the information attached about the flu and infection prevention in the household.  Of also prescribed Tamiflu which may help reduce the severity and length of your flu symptoms Get help right away if: You become short of breath or have trouble breathing. Your skin or nails turn blue. You have very bad pain or stiffness in your neck. You get a sudden headache or pain in your face or ear. You vomit each time you eat or drink. These symptoms may be an emergency. Call 911 right away.

## 2023-12-17 NOTE — ED Triage Notes (Signed)
 Pt arrived from [facility], for MRI. Pt resting with mask on.

## 2023-12-17 NOTE — Consult Note (Signed)
 NEUROLOGY CONSULT NOTE   Date of service: December 17, 2023 Patient Name: Sara Mueller MRN:  [id number] DOB:  [birth year] Chief Complaint: "right leg heaviness, back pain and dizziness" Requesting Provider: Jacalyn Lefevre, MD  History of Present Illness  Sara Mueller is a 23 y.o. female with hx of intermittent asthma and vitamin D deficiency who presents to [facility] ED for evaluation of persistent back pain. She states on Tuesday she started to not feel well and rested and then 2 days ago she started having back pain on her left side. Yesterday while at work she still had back pain along with some dizziness and she felt like her right leg was heavy- like all her weight was on the right leg and was having a hard time walking. She states that the last few weeks she has a had a cough but no sickness. She endorses that at times may have some mild back pain but this is different than her typical pain she experiences.  MRI lumbar with no acute process. She is (+) for Flu B.    ROS  Comprehensive ROS performed and pertinent positives documented in HPI    Past History   Past Medical History:  Diagnosis Date   Constipation    COVID-19    Headache    Intermittent asthma    QVAR rx'd in the past for more persistent symptoms,?compliant?.   Ovarian cyst    Pregnancy 08/18/2022   Vacuum extractor delivery, delivered 08/21/2022   Vitamin D deficiency     Past Surgical History:  Procedure Laterality Date   NO PAST SURGERIES      Family History: Family History  Problem Relation Age of Onset   Asthma Brother    Seizures Brother     Social History  reports that she has never smoked. She has never used smokeless tobacco. She reports that she does not drink alcohol and does not use drugs.  No Known Allergies  Medications   Current Facility-Administered Medications:    acetaminophen (TYLENOL) tablet 650 mg, 650 mg, Oral, Q6H PRN, Tilden Fossa, MD   dextromethorphan-guaiFENesin [facility] DM)  30-600 MG per 12 hr tablet 1 tablet, 1 tablet, Oral, BID, Harris, Abigail, PA-C   diazepam (VALIUM) injection 2.5 mg, 2.5 mg, Intravenous, Once PRN, Harris, Abigail, PA-C   methocarbamol (ROBAXIN) tablet 500 mg, 500 mg, Oral, Q6H PRN, Tilden Fossa, MD  Current Outpatient Medications:    cyclobenzaprine (FLEXERIL) 10 MG tablet, Take 1 tablet (10 mg total) by mouth every 8 (eight) hours as needed for muscle spasms. (Patient not taking: Reported on 09/29/2022), Disp: 30 tablet, Rfl: 1   docusate sodium (COLACE) 100 MG capsule, Take 1 capsule (100 mg total) by mouth 2 (two) times daily as needed., Disp: 30 capsule, Rfl: 2   Ferrous Sulfate (IRON SUPPLEMENT PO), Take by mouth., Disp: , Rfl:    ibuprofen (ADVIL) 600 MG tablet, Take 1 tablet (600 mg total) by mouth every 6 (six) hours., Disp: 30 tablet, Rfl: 2   oxyCODONE (OXY IR/ROXICODONE) 5 MG immediate release tablet, Take 1 tablet (5 mg total) by mouth every 6 (six) hours as needed for severe pain. (Patient not taking: Reported on 09/29/2022), Disp: 10 tablet, Rfl: 0   polyethylene glycol (MIRALAX / GLYCOLAX) 17 g packet, Take 17 g by mouth daily., Disp: 14 each, Rfl: 0   Prenatal Vit-Fe Fumarate-FA (PRENATAL 1+1 PO), Take by mouth. (Patient not taking: Reported on 09/29/2022), Disp: , Rfl:    terconazole (TERAZOL 3)  0.8 % vaginal cream, Place 1 applicator vaginally at bedtime. (Patient not taking: Reported on 09/29/2022), Disp: 20 g, Rfl: 0   witch hazel-glycerin (TUCKS) pad, Apply 1 Application topically as needed for hemorrhoids. (Patient not taking: Reported on 09/29/2022), Disp: 40 each, Rfl: 12  Vitals   Vitals:   2023/12/25 2349 12/17/23 0357 12/17/23 0620 12/17/23 0929  BP: 116/61 (!) 104/53 93/71 95/61   Pulse: 92 86 69 80  Resp: 20 17 20 16   Temp: 99.2 F (37.3 C) 98.3 F (36.8 C) 98.7 F (37.1 C) 98.7 F (37.1 C)  TempSrc: Oral Oral Oral Oral  SpO2: 100% 99% 100% 100%  Weight:      Height:        Body mass index is 28.35  kg/m.  Physical Exam   Constitutional: Appears well-developed and well-nourished.   Psych: Affect appropriate to situation.   Eyes: No scleral injection.   HENT: No OP obstruction.   Head: Normocephalic.   Cardiovascular: Normal rate and regular rhythm.   Respiratory: Effort normal, non-labored breathing.   GI: Soft.  No distension. There is no tenderness.   Skin: WDI.    Neurologic Examination  Mental Status -  Level of arousal and orientation to time, place, and person were intact . Language including expression, naming, repetition, comprehension was assessed and found intact . Attention span and concentration were normal . Recent and remote memory were intact . Fund of Knowledge was assessed and was intact .  Cranial Nerves II - XII - II - Visual field intact OU . III, IV, VI - Extraocular movements intact. V - Facial sensation intact bilaterally. VII - Facial movement intact bilaterally. VIII - Hearing & vestibular intact bilaterally. X - Palate elevates symmetrically. XI - Chin turning & shoulder shrug intact bilaterally. XII - Tongue protrusion intact.  Motor Strength - The patient's strength was normal in all extremities and pronator drift was absent.  Bulk was normal and fasciculations were absent.   Motor Tone - Muscle tone was assessed at the neck and appendages and was normal. Reflexes - The patient's reflexes were symmetrical in all extremities Sensory - Light touch, temperature/pinprick were assessed and were symmetrical.   Coordination - The patient had normal movements in the hands and feet with no ataxia or dysmetria.  Tremor was absent. Gait and Station - deferred.  Labs/Imaging/Neurodiagnostic studies   CBC:  Recent Labs  Lab 12-25-2023 1945  WBC 7.2  NEUTROABS 4.7  HGB 12.5  HCT 40.7  MCV 76.2*  PLT 112*   Basic Metabolic Panel:  Lab Results  Component Value Date   NA 134 (L) 2023/12/25   K 3.6 12-25-2023   CO2 17 (L) 2023-12-25   GLUCOSE 95  12/25/23   BUN 9 12-25-2023   CREATININE 0.92 12-25-23   CALCIUM 8.7 (L) 2023/12/25   GFRNONAA >60 12-25-2023   GFRAA >60 02/01/2017   Lipid Panel: No results found for: "LDLCALC" HgbA1c: No results found for: "HGBA1C" Urine Drug Screen: No results found for: "LABOPIA", "COCAINSCRNUR", "LABBENZ", "AMPHETMU", "THCU", "LABBARB"  Alcohol Level No results found for: "ETH" INR No results found for: "INR" APTT No results found for: "APTT" AED levels: No results found for: "PHENYTOIN", "ZONISAMIDE", "LAMOTRIGINE", "LEVETIRACETA"  MRI Brain(Personally reviewed): ordered  MRI lumbar spine  No acute process   MRI c spine, T spine and brain ordered   ASSESSMENT   Sara Mueller is a 23 y.o. female  hx of intermittent asthma and vitamin D deficiency who presents to  [facility] ED for evaluation of persistent back pain.She has had a cough for the past few weakness but no sickness. This past week she started to feel bad and rested for a day or so and then 2 days ago developed low back pain, dizziness and vomiting. She also endorses feeling like a right leg was very heavy and causing her trouble with walking. She is Flu B positive her   RECOMMENDATIONS  Obtain MRI brain, C and T spine  Neurology will continue to follow if the MRIs are abnormal, otherwise we will be available as needed  _____________________________________________________________________   Gevena Mart DNP, ACNPC-AG  [facility]  I have seen the patient and reviewed the above note.  She does complain of subjective numbness and weakness of her right lower extremity, however to confrontational testing she actually has very good strength with the exception of mild hip flexion weakness on the right.  She has circumferential numbness to both light touch and temperature on the right compared to the left.  With normal imaging, my suspicion for any type of inflammatory CNS disease is very low.  With normal reflexes at both the  ankle and the knee, as well as the asymmetry, my suspicion for any type of acute polyneuropathy (Guillain-Barr syndrome) is also very low.  As long as her MRIs are negative, I would favor following up as an outpatient and if her symptoms persist, consider EMG/nerve conduction study.  Neurology will follow with the patient has an abnormal MRI, otherwise we will sign off.  Ritta Slot, MD [facility]   If 7pm- 7am, please page neurology on call as listed in AMION.

## 2023-12-17 NOTE — ED Notes (Signed)
 Patient transferred to [facility] stretcher at this time. Heading towards [facility].

## 2023-12-17 NOTE — ED Notes (Signed)
 This RN reviewed discharge instructions with patient. She verbalized understanding and denied any further questions. PT well appearing upon discharge and reports no pain. Pt wheeled to lobby with family and is being picked up by family.

## 2023-12-17 NOTE — ED Provider Notes (Signed)
 23 year old female sent in for evaluation of right lower extremity weakness, positive for flu B here with back pain.  MRI pending.  MRI has resulted.  I visualized and interpreted these results.  They are negative for any acute finding.   Patient states that her back is not hurting but her right leg is heavy and feels weak.  She is having difficulty ambulating.  I was unable to initially elicit reflexes however I did get faint reflex in the right patellar tendon I got a reflexia in the left leg.  She has no abnormal sensory changes on the left.  Patient is able to ambulate but requires assistance.   BP 95/61 (BP Location: Right Arm)   Pulse 80   Temp 98.7 F (37.1 C) (Oral)   Resp 16   Ht 5\' 3"  (1.6 m)   Wt 72.6 kg   LMP 12/12/2023   SpO2 100%   BMI 28.35 kg/m   Physical Exam On my exam patient appears overall ill without appearing toxic.  She has weakness on the right side.  Reflex is present at the patella on the right, a reflexive on the left.  Normal strength on the left side, assisted gait with difficulty ambulating. Procedures  Procedures  ED Course / MDM   Clinical Course as of 12/17/23 1307  Sat Dec 17, 2023  5366 Case discussed with Dr. Amada Jupiter who recommends MRI brain C-spine thoracic spine with without.  Patient updated and is okay with the plan.  Diet ordered, ibuprofen ordered.  Will order cough medication. [AH]  1307 My results are all negative.  Case discussed with Dr. Amada Jupiter.  No further workup needed at this time advises outpatient follow-up with neurology.  Patient will be discharged at this time. [AH]    Clinical Course User Index [AH] Arthor Captain, PA-C    Clinical Course as of 12/17/23 1307  Sat Dec 17, 2023  4403 Case discussed with Dr. Amada Jupiter who recommends MRI brain C-spine thoracic spine with without.  Patient updated and is okay with the plan.  Diet ordered, ibuprofen ordered.  Will order cough medication. [AH]  1307 My results are all  negative.  Case discussed with Dr. Amada Jupiter.  No further workup needed at this time advises outpatient follow-up with neurology.  Patient will be discharged at this time. [AH]    Clinical Course User Index [AH] Arthor Captain, PA-C    Medical Decision Making Amount and/or Complexity of Data Reviewed Labs: ordered. Radiology: ordered.  Risk OTC drugs. Prescription drug management.   Patient out of the window for Tamiflu as she had onset of symptoms 4 days ago after discussion.  Will give Zofran because she is currently nauseated.  Her MRI is negative.  Given outpatient follow-up with neurology.  Appropriate for discharge at this time.       Arthor Captain, PA-C 12/17/23 1537    Jacalyn Lefevre, MD 12/17/23 1540

## 2023-12-17 NOTE — ED Notes (Signed)
Pt being transported to MRI.

## 2023-12-17 NOTE — ED Provider Notes (Addendum)
 [facility] Provider Note   CSN: [id number] Arrival date & time: 12/16/23  1938     History  Chief Complaint  Patient presents with   Dizziness   Back Pain    Sara Mueller is a 23 y.o. female.  The history is provided by the patient.  Dizziness Back Pain Sara Mueller is a 23 y.o. female who presents to the Emergency Department complaining of back pain.  She presents to the emergency department for evaluation of 2 days of what initially was generalized back pain but now it is located on her left low back.  Pain is nonradiating and constant in nature but worse with movement.  No associated fever.  She does report a couple weeks of cough.  No shortness of breath, abdominal pain.  She did vomit yesterday and felt nausea throughout the day today.  She was very fatigued yesterday and slept throughout the day.  No dysuria.  Today she started feeling dizzy as well as weak in her right leg.  No reported injuries.  She has no known medical problems and takes no routine medications.    Home Medications Prior to Admission medications   Medication Sig Start Date End Date Taking? Authorizing Provider  cyclobenzaprine (FLEXERIL) 10 MG tablet Take 1 tablet (10 mg total) by mouth every 8 (eight) hours as needed for muscle spasms. Patient not taking: Reported on 09/29/2022 07/28/22   Bernerd Limbo, CNM  docusate sodium (COLACE) 100 MG capsule Take 1 capsule (100 mg total) by mouth 2 (two) times daily as needed. 08/21/22   Aviva Signs, CNM  Ferrous Sulfate (IRON SUPPLEMENT PO) Take by mouth.    [provider]  ibuprofen (ADVIL) 600 MG tablet Take 1 tablet (600 mg total) by mouth every 6 (six) hours. 10/13/22   Federico Flake, MD  oxyCODONE (OXY IR/ROXICODONE) 5 MG immediate release tablet Take 1 tablet (5 mg total) by mouth every 6 (six) hours as needed for severe pain. Patient not taking: Reported on 09/29/2022 08/21/22   Aviva Signs, CNM  polyethylene glycol (MIRALAX / GLYCOLAX) 17 g packet Take 17 g by mouth daily. 08/21/22   Aviva Signs, CNM  Prenatal Vit-Fe Fumarate-FA (PRENATAL 1+1 PO) Take by mouth. Patient not taking: Reported on 09/29/2022    [provider]  terconazole (TERAZOL 3) 0.8 % vaginal cream Place 1 applicator vaginally at bedtime. Patient not taking: Reported on 09/29/2022 08/16/22   Reva Bores, MD  witch hazel-glycerin (TUCKS) pad Apply 1 Application topically as needed for hemorrhoids. Patient not taking: Reported on 09/29/2022 08/21/22   Aviva Signs, CNM      Allergies    Patient has no known allergies.    Review of Systems   Review of Systems  Musculoskeletal:  Positive for back pain.  Neurological:  Positive for dizziness.  All other systems reviewed and are negative.   Physical Exam Updated Vital Signs BP (!) 104/53 (BP Location: Right Arm)   Pulse 86   Temp 98.3 F (36.8 C) (Oral)   Resp 17   Ht 5\' 3"  (1.6 m)   Wt 72.6 kg   LMP 12/12/2023   SpO2 99%   BMI 28.35 kg/m  Physical Exam Vitals and nursing note reviewed.  Constitutional:      Appearance: She is well-developed.  HENT:     Head: Normocephalic and atraumatic.  Cardiovascular:     Rate and Rhythm: Normal rate  and regular rhythm.     Heart sounds: No murmur heard. Pulmonary:     Effort: Pulmonary effort is normal. No respiratory distress.     Breath sounds: Normal breath sounds.  Abdominal:     Palpations: Abdomen is soft.     Tenderness: There is no abdominal tenderness. There is no guarding or rebound.  Musculoskeletal:        General: No swelling or tenderness.     Comments: 2+ DP pulses bilaterally  Skin:    General: Skin is warm and dry.  Neurological:     Mental Status: She is alert and oriented to person, place, and time.     Comments: 4+/5 strength in RLE 5/5 strength in LLE.  Sensation to light touch in bilateral lower extremities.   Psychiatric:        Behavior:  Behavior normal.     ED Results / Procedures / Treatments   Labs (all labs ordered are listed, but only abnormal results are displayed) Labs Reviewed  RESP PANEL BY RT-PCR (RSV, FLU A&B, COVID)  RVPGX2 - Abnormal; Notable for the following components:      Result Value   Influenza B by PCR POSITIVE (*)    All other components within normal limits  CBC WITH DIFFERENTIAL/PLATELET - Abnormal; Notable for the following components:   RBC 5.34 (*)    MCV 76.2 (*)    MCH 23.4 (*)    Platelets 112 (*)    All other components within normal limits  COMPREHENSIVE METABOLIC PANEL - Abnormal; Notable for the following components:   Sodium 134 (*)    CO2 17 (*)    Calcium 8.7 (*)    All other components within normal limits  URINALYSIS, ROUTINE W REFLEX MICROSCOPIC - Abnormal; Notable for the following components:   Hgb urine dipstick SMALL (*)    All other components within normal limits  URINALYSIS, MICROSCOPIC (REFLEX) - Abnormal; Notable for the following components:   Bacteria, UA FEW (*)    All other components within normal limits  PREGNANCY, URINE    EKG EKG Interpretation Date/Time:  Saturday December 17 2023 00:53:10 EST Ventricular Rate:  77 PR Interval:  167 QRS Duration:  83 QT Interval:  367 QTC Calculation: 416 R Axis:   60  Text Interpretation: Sinus rhythm Confirmed by Tilden Fossa [phone]) on 12/17/2023 3:46:46 AM  Radiology CT Renal Stone Study Result Date: 12/17/2023 CLINICAL DATA:  Lower back pain x2 days. EXAM: CT ABDOMEN AND PELVIS WITHOUT CONTRAST TECHNIQUE: Multidetector CT imaging of the abdomen and pelvis was performed following the standard protocol without IV contrast. RADIATION DOSE REDUCTION: This exam was performed according to the departmental dose-optimization program which includes automated exposure control, adjustment of the mA and/or kV according to patient size and/or use of iterative reconstruction technique. COMPARISON:  December 24, 2022 FINDINGS:  Lower chest: No acute abnormality. Hepatobiliary: No focal liver abnormality is seen. No gallstones, gallbladder wall thickening, or biliary dilatation. Pancreas: Unremarkable. No pancreatic ductal dilatation or surrounding inflammatory changes. Spleen: Normal in size without focal abnormality. Adrenals/Urinary Tract: Adrenal glands are unremarkable. Kidneys are normal, without renal calculi, focal lesion, or hydronephrosis. Bladder is unremarkable. Stomach/Bowel: Stomach is within normal limits. The appendix is not clearly identified. No evidence of bowel wall thickening, distention, or inflammatory changes. Vascular/Lymphatic: No significant vascular findings are present. No enlarged abdominal or pelvic lymph nodes. Reproductive: Uterus and bilateral adnexa are unremarkable. Other: No abdominal wall hernia or abnormality. No abdominopelvic ascites. Musculoskeletal: No  acute or significant osseous findings. IMPRESSION: 1. No acute or active process within the abdomen or pelvis. 2. Nonvisualization of the appendix. Follow-up abdomen and pelvis CT with oral contrast is recommended if appendicitis is of clinical concern. Electronically Signed   By: Aram Candela M.D.   On: 12/17/2023 02:58   DG Chest Port 1 View Result Date: 12/17/2023 CLINICAL DATA:  Dizziness and nausea with lower back pain x2 days. EXAM: PORTABLE CHEST 1 VIEW COMPARISON:  December 04, 2021 FINDINGS: The heart size and mediastinal contours are within normal limits. Two adjacent 2 mm pulmonary nodules versus vessels on end are seen overlying the upper right lung. Both lungs are otherwise clear. The visualized skeletal structures are unremarkable. IMPRESSION: No active cardiopulmonary disease. Electronically Signed   By: Aram Candela M.D.   On: 12/17/2023 01:58    Procedures Procedures    Medications Ordered in ED Medications  sodium chloride 0.9 % bolus 500 mL (0 mLs Intravenous Stopped 12/17/23 0226)  ketorolac (TORADOL) 15 MG/ML  injection 15 mg (15 mg Intravenous Given 12/17/23 0213)    ED Course/ Medical Decision Making/ A&P                                 Medical Decision Making Amount and/or Complexity of Data Reviewed Labs: ordered. Radiology: ordered.  Risk OTC drugs. Prescription drug management.   Patient without significant medical history here for evaluation of acute left-sided low back pain with right lower extremity weakness.  She does have mild weakness on examination.  She does happen to be positive for flu B.  UA is not consistent with UTI.  No reported fevers, no leukocytosis, no IV drug use or risk factors for epidural abscess.  Given her acute weakness plan to obtain MRI to evaluate for acute cord compression.  CT is negative for obstructing stone.  Current clinical picture is not consistent with appendicitis as she has no abdominal tenderness.  Plan to transfer ED to ED for MRI to further evaluate her right lower extremity weakness.  Discussed with Dr. Eudelia Bunch in the [facility] emergency department, who accepts the patient in transfer.  Patient updated of findings of studies and recommendation for additional imaging and she is in agreement with treatment plan.        Final Clinical Impression(s) / ED Diagnoses Final diagnoses:  Influenza B  Acute left-sided low back pain without sciatica    Rx / DC Orders ED Discharge Orders     None         Tilden Fossa, MD 12/17/23 1610    Tilden Fossa, MD 12/17/23 [phone]

## 2024-02-08 NOTE — Telephone Encounter (Signed)
Preadmission screening
